# Patient Record
Sex: Male | Born: 1985 | Race: White | Hispanic: No
Health system: Midwestern US, Community
[De-identification: ages and names within clinical notes are randomized; demographics above are authoritative.]

## PROBLEM LIST (undated history)

## (undated) ENCOUNTER — Emergency Department: Discharge status: Absent without leave | Payer: Self-pay

## (undated) DIAGNOSIS — S4990XA Unspecified injury of shoulder and upper arm, unspecified arm, initial encounter: Secondary | ICD-10-CM

## (undated) HISTORY — PX: MULTIPLE TOOTH EXTRACTIONS: SHX2053

---

## 2007-04-30 ENCOUNTER — Emergency Department: Payer: Self-pay | Admitting: Emergency Medicine

## 2007-09-13 ENCOUNTER — Emergency Department: Payer: Self-pay | Admitting: Emergency Medicine

## 2008-06-04 ENCOUNTER — Emergency Department: Payer: Self-pay | Admitting: Emergency Medicine

## 2008-10-28 ENCOUNTER — Emergency Department: Payer: Self-pay | Admitting: Internal Medicine

## 2010-06-06 ENCOUNTER — Emergency Department: Payer: Self-pay | Admitting: Emergency Medicine

## 2012-02-22 ENCOUNTER — Emergency Department: Payer: Self-pay | Admitting: Emergency Medicine

## 2012-05-17 ENCOUNTER — Emergency Department: Payer: Self-pay | Admitting: Emergency Medicine

## 2013-05-28 ENCOUNTER — Emergency Department: Payer: Self-pay | Admitting: Emergency Medicine

## 2013-06-06 ENCOUNTER — Emergency Department: Payer: Self-pay | Admitting: Emergency Medicine

## 2013-06-06 LAB — COMPREHENSIVE METABOLIC PANEL
Alkaline Phosphatase: 120 U/L (ref 50–136)
Anion Gap: 3 — ABNORMAL LOW (ref 7–16)
BUN: 12 mg/dL (ref 7–18)
Bilirubin,Total: 0.5 mg/dL (ref 0.2–1.0)
Chloride: 103 mmol/L (ref 98–107)
Co2: 28 mmol/L (ref 21–32)
Creatinine: 1.06 mg/dL (ref 0.60–1.30)
EGFR (African American): >60
EGFR (Non-African Amer.): >60
Glucose: 94 mg/dL (ref 65–99)
Osmolality: 268 (ref 275–301)
Potassium: 4.1 mmol/L (ref 3.5–5.1)
Total Protein: 8.2 g/dL (ref 6.4–8.2)

## 2013-06-06 LAB — URINALYSIS, COMPLETE
Ph: 6 (ref 4.5–8.0)
Specific Gravity: 1.029 (ref 1.003–1.030)
Squamous Epithelial: <1
WBC UR: 1 /HPF (ref 0–5)

## 2013-06-06 LAB — CBC
HGB: 15.9 g/dL (ref 13.0–18.0)
MCH: 29.6 pg (ref 26.0–34.0)
MCV: 86 fL (ref 80–100)
RBC: 5.39 10*6/uL (ref 4.40–5.90)
RDW: 12.8 % (ref 11.5–14.5)
WBC: 9 10*3/uL (ref 3.8–10.6)

## 2013-10-12 ENCOUNTER — Emergency Department: Payer: Self-pay | Admitting: Internal Medicine

## 2015-08-28 ENCOUNTER — Emergency Department
Admission: EM | Admit: 2015-08-28 | Discharge: 2015-08-28 | Disposition: A | Discharge status: Home / Self Care | Payer: Managed Care, Other (non HMO) | Attending: Emergency Medicine | Admitting: Emergency Medicine

## 2015-08-28 ENCOUNTER — Encounter: Payer: Self-pay | Admitting: Emergency Medicine

## 2015-08-28 DIAGNOSIS — K2970 Gastritis, unspecified, without bleeding: Secondary | ICD-10-CM | POA: Insufficient documentation

## 2015-08-28 DIAGNOSIS — Z87891 Personal history of nicotine dependence: Secondary | ICD-10-CM | POA: Insufficient documentation

## 2015-08-28 DIAGNOSIS — R51 Headache: Secondary | ICD-10-CM | POA: Insufficient documentation

## 2015-08-28 DIAGNOSIS — R1110 Vomiting, unspecified: Secondary | ICD-10-CM | POA: Diagnosis present

## 2015-08-28 DIAGNOSIS — H53149 Visual discomfort, unspecified: Secondary | ICD-10-CM | POA: Diagnosis not present

## 2015-08-28 LAB — CBC
HEMATOCRIT: 41.5 % (ref 40.0–52.0)
Hemoglobin: 14.2 g/dL (ref 13.0–18.0)
MCH: 30.4 pg (ref 26.0–34.0)
MCHC: 34.2 g/dL (ref 32.0–36.0)
MCV: 88.7 fL (ref 80.0–100.0)
Platelets: 228 10*3/uL (ref 150–440)
RBC: 4.68 MIL/uL (ref 4.40–5.90)
RDW: 12.9 % (ref 11.5–14.5)
WBC: 11 10*3/uL — ABNORMAL HIGH (ref 3.8–10.6)

## 2015-08-28 LAB — COMPREHENSIVE METABOLIC PANEL
ALBUMIN: 4.6 g/dL (ref 3.5–5.0)
ALK PHOS: 72 U/L (ref 38–126)
ALT: 13 U/L — ABNORMAL LOW (ref 17–63)
AST: 19 U/L (ref 15–41)
Anion gap: 7 (ref 5–15)
BILIRUBIN TOTAL: 1.3 mg/dL — AB (ref 0.3–1.2)
BUN: 17 mg/dL (ref 6–20)
CO2: 24 mmol/L (ref 22–32)
Calcium: 9.3 mg/dL (ref 8.9–10.3)
Chloride: 101 mmol/L (ref 101–111)
Creatinine, Ser: 0.92 mg/dL (ref 0.61–1.24)
GFR calc Af Amer: >60 mL/min (ref >60)
GFR calc non Af Amer: >60 mL/min (ref >60)
GLUCOSE: 128 mg/dL — AB (ref 65–99)
POTASSIUM: 3.6 mmol/L (ref 3.5–5.1)
SODIUM: 132 mmol/L — AB (ref 135–145)
TOTAL PROTEIN: 7.5 g/dL (ref 6.5–8.1)

## 2015-08-28 LAB — LIPASE, BLOOD: Lipase: 29 U/L (ref 11–51)

## 2015-08-28 MED ORDER — SUCRALFATE 1 G PO TABS: 1.0000 g | ORAL_TABLET | Freq: Four times a day (QID) | ORAL | Status: DC | PRN

## 2015-08-28 MED ORDER — MORPHINE SULFATE (PF) 4 MG/ML IV SOLN
INTRAVENOUS | Status: AC
  Administered 2015-08-28: 4 mg via INTRAVENOUS
  Filled 2015-08-28: qty 1

## 2015-08-28 MED ORDER — ONDANSETRON HCL 4 MG/2ML IJ SOLN
4.0000 mg | Freq: Once | INTRAMUSCULAR | Status: AC | PRN
  Administered 2015-08-28: 4 mg via INTRAVENOUS
  Filled 2015-08-28: qty 2

## 2015-08-28 MED ORDER — MORPHINE SULFATE (PF) 4 MG/ML IV SOLN
4.0000 mg | Freq: Once | INTRAVENOUS | Status: AC
  Administered 2015-08-28: 4 mg via INTRAVENOUS

## 2015-08-28 MED ORDER — SODIUM CHLORIDE 0.9 % IV BOLUS (SEPSIS)
1000.0000 mL | INTRAVENOUS | Status: AC
  Administered 2015-08-28: 1000 mL via INTRAVENOUS

## 2015-08-28 MED ORDER — ONDANSETRON HCL 4 MG PO TABS: ORAL_TABLET | ORAL | Status: DC

## 2015-08-28 NOTE — ED Notes (Signed)
Pt to rm 15 via EMS from home.  PT reports he was awoken by severe generalized abd pain, described as tight, and vomited x 7.  Per EMS, emesis was clear.  Pt denies eating anything unusual.  Pt reports mild headache after work yesterday.  Pt NAD at this time, respirations equal, mildly labored and tachypneic, skin warm and dry.

## 2015-08-28 NOTE — ED Notes (Signed)
Pt's o2sat 75% on RA while pt almost asleep.  2L via [place] applied, pt o2sat 100%.

## 2015-08-28 NOTE — ED Notes (Signed)
Pt oxygen turned off to assess if pt can maintain o2sat on his own

## 2015-08-28 NOTE — ED Provider Notes (Signed)
[facility] Emergency Department Provider Note  ____________________________________________  Time seen: Approximately 5:48 AM  I have reviewed the triage vital signs and the nursing notes.   HISTORY  Chief Complaint Emesis    HPI Jesus Butler is a 30 y.o. male who arrives by EMS and who has no significant past medical history who presents with acute onset of vomiting multiple times just prior to arrival.He states that he did not feel well all day yesterday but was able to work his full shift.  He has not had much appetite.  When he came home he was fell asleep for a little bit on the couch and then he got up to go to bed but then immediately became very nauseated to the point of vomiting while he was running to the bathroom.  He vomited multiple times in a row "a bunch of clear liquid".  He has had generalized abdominal cramping as well which was severe but now is mild.  He had 2 loose stools over the last 24-48 hours but no copious diarrhea.  He endorses subjective chills.  He denies chest pain and shortness of breath.  He did not have any bad food exposures of which she is aware and has no recent sick contacts.   History reviewed. No pertinent past medical history.  There are no active problems to display for this patient.   History reviewed. No pertinent past surgical history.  Current Outpatient Rx  Name  Route  Sig  Dispense  Refill  . ondansetron (ZOFRAN) 4 MG tablet      Take 1-2 tabs by mouth every 8 hours as needed for nausea/vomiting   30 tablet   0   . sucralfate (CARAFATE) 1 g tablet   Oral   Take 1 tablet (1 g total) by mouth 4 (four) times daily as needed (for nausea/vomiting or abdominal discomfort).   30 tablet   1     Allergies Review of patient's allergies indicates no known allergies.  History reviewed. No pertinent family history.  Social History Social History  Substance Use Topics  . Smoking status: Former Games developer  .  Smokeless tobacco: Never Used  . Alcohol Use: Yes    Review of Systems Constitutional: Subjective fever/chills Eyes: No visual changes. ENT: No sore throat. Cardiovascular: Denies chest pain. Respiratory: Denies shortness of breath. Gastrointestinal: Generalized abdominal cramping and multiple episodes of vomiting and 2 loose stools Genitourinary: Negative for dysuria. Musculoskeletal: Negative for back pain. Skin: Negative for rash. Neurological: Generalized global headache with mild photophobia  10-point ROS otherwise negative.  ____________________________________________   PHYSICAL EXAM:  VITAL SIGNS: ED Triage Vitals  Enc Vitals Group     BP 08/28/15 0524 121/64 mmHg     Pulse Rate 08/28/15 0524 72     Resp 08/28/15 0524 25     Temp 08/28/15 0524 97.5 F (36.4 C)     Temp src --      SpO2 08/28/15 0524 100 %     Weight 08/28/15 0524 142 lb (64.411 kg)     Height 08/28/15 0524  (1.778 m)     Head Cir --      Peak Flow --      Pain Score 08/28/15 0525 6     Pain Loc --      Pain Edu? --      Excl. in GC? --     Constitutional: Alert and oriented. Generally Well appearing though he does appear mildly uncomfortable.  Eyes: Conjunctivae are normal. PERRL. EOMI. Head: Atraumatic. Nose: No congestion/rhinnorhea. Mouth/Throat: Mucous membranes are moist.  Oropharynx non-erythematous. Neck: No stridor.   Cardiovascular: Normal rate, regular rhythm. Grossly normal heart sounds.  Good peripheral circulation. Respiratory: Normal respiratory effort.  No retractions. Lungs CTAB. Gastrointestinal: Healthy habitus.  Soft.  Mild tenderness to palpation of epigastrium.  No RUQ tenderness nor Murphy's sign.  No RLQ tenderness nor pain and McBurney's point. Musculoskeletal: No lower extremity tenderness nor edema.  No joint effusions. Neurologic:  Normal speech and language. No gross focal neurologic deficits are appreciated.  Skin:  Skin is warm, dry and intact. No rash  noted. Psychiatric: Mood and affect are anxious. Speech normal.  ____________________________________________   LABS (all labs ordered are listed, but only abnormal results are displayed)  Labs Reviewed  COMPREHENSIVE METABOLIC PANEL - Abnormal; Notable for the following:    Sodium 132 (*)    Glucose, Bld 128 (*)    ALT 13 (*)    Total Bilirubin 1.3 (*)    All other components within normal limits  CBC - Abnormal; Notable for the following:    WBC 11.0 (*)    All other components within normal limits  LIPASE, BLOOD   ____________________________________________  EKG  ED ECG REPORT I, Thamas Appleyard, the attending physician, personally viewed and interpreted this ECG.  Date: 08/28/2015 EKG Time: 05:26 Rate: 64 Rhythm: normal sinus rhythm QRS Axis: normal Intervals: normal ST/T Wave abnormalities: normal Conduction Disutrbances: none Narrative Interpretation: unremarkable  ____________________________________________  RADIOLOGY   No results found.  ____________________________________________   PROCEDURES  Procedure(s) performed: None  Critical Care performed: No ____________________________________________   INITIAL IMPRESSION / ASSESSMENT AND PLAN / ED COURSE  Pertinent labs & imaging results that were available during my care of the patient were reviewed by me and considered in my medical decision making (see chart for details).  Signs/symptoms c/w gastritis vs gastroenteritis (if you include the recent loose stools).  Labs reassuring, VSS, febrile.  Provided morphine 4 mg IV for complaints of general malaise and epigastric pain, somnolent now.  Symptoms controlled.    I gave my usual and customary return precautions.     ____________________________________________  FINAL CLINICAL IMPRESSION(S) / ED DIAGNOSES  Final diagnoses:  Gastritis      NEW MEDICATIONS STARTED DURING THIS VISIT:  New Prescriptions   ONDANSETRON (ZOFRAN) 4 MG TABLET     Take 1-2 tabs by mouth every 8 hours as needed for nausea/vomiting   SUCRALFATE (CARAFATE) 1 G TABLET    Take 1 tablet (1 g total) by mouth 4 (four) times daily as needed (for nausea/vomiting or abdominal discomfort).     Loleta Rose, MD 08/28/15 [id number]

## 2015-08-28 NOTE — Discharge Instructions (Signed)

## 2015-08-28 NOTE — ED Notes (Signed)
Pt c/o pain above iv site when IV removed. Site assessed, vein felt soft, painful to touch, no s/s of infiltration. Pt told to watch for s/s of infection, ie, increasing pain, redness above site, drainage, and to come here or go to acute care if he noticed any change.

## 2015-08-28 NOTE — ED Notes (Signed)
Pt states unable to give urine sample at this time.  Pt given urinal and asked to press call bell if he needs assistance.  Pt verbalized understanding.

## 2015-10-07 ENCOUNTER — Emergency Department
Admission: EM | Admit: 2015-10-07 | Discharge: 2015-10-07 | Disposition: A | Discharge status: Home / Self Care | Payer: Managed Care, Other (non HMO) | Attending: Emergency Medicine | Admitting: Emergency Medicine

## 2015-10-07 DIAGNOSIS — K922 Gastrointestinal hemorrhage, unspecified: Secondary | ICD-10-CM | POA: Insufficient documentation

## 2015-10-07 DIAGNOSIS — Z87891 Personal history of nicotine dependence: Secondary | ICD-10-CM | POA: Diagnosis not present

## 2015-10-07 DIAGNOSIS — Z791 Long term (current) use of non-steroidal anti-inflammatories (NSAID): Secondary | ICD-10-CM | POA: Diagnosis not present

## 2015-10-07 DIAGNOSIS — K625 Hemorrhage of anus and rectum: Secondary | ICD-10-CM | POA: Diagnosis present

## 2015-10-07 LAB — COMPREHENSIVE METABOLIC PANEL
ALBUMIN: 4.9 g/dL (ref 3.5–5.0)
ALK PHOS: 84 U/L (ref 38–126)
ALT: 14 U/L — ABNORMAL LOW (ref 17–63)
ANION GAP: 7 (ref 5–15)
AST: 21 U/L (ref 15–41)
BUN: 26 mg/dL — ABNORMAL HIGH (ref 6–20)
CO2: 29 mmol/L (ref 22–32)
Calcium: 10.1 mg/dL (ref 8.9–10.3)
Chloride: 103 mmol/L (ref 101–111)
Creatinine, Ser: 0.98 mg/dL (ref 0.61–1.24)
GFR calc Af Amer: >60 mL/min (ref >60)
GFR calc non Af Amer: >60 mL/min (ref >60)
GLUCOSE: 95 mg/dL (ref 65–99)
POTASSIUM: 4.3 mmol/L (ref 3.5–5.1)
SODIUM: 139 mmol/L (ref 135–145)
Total Bilirubin: 1.1 mg/dL (ref 0.3–1.2)
Total Protein: 7.8 g/dL (ref 6.5–8.1)

## 2015-10-07 LAB — CBC
HEMATOCRIT: 44.4 % (ref 40.0–52.0)
HEMOGLOBIN: 15.2 g/dL (ref 13.0–18.0)
MCH: 30.1 pg (ref 26.0–34.0)
MCHC: 34.2 g/dL (ref 32.0–36.0)
MCV: 88 fL (ref 80.0–100.0)
Platelets: 243 10*3/uL (ref 150–440)
RBC: 5.04 MIL/uL (ref 4.40–5.90)
RDW: 13 % (ref 11.5–14.5)
WBC: 6.8 10*3/uL (ref 3.8–10.6)

## 2015-10-07 LAB — TYPE AND SCREEN
ABO/RH(D): A NEG
Antibody Screen: NEGATIVE

## 2015-10-07 LAB — ABO/RH: ABO/RH(D): A NEG

## 2015-10-07 NOTE — ED Notes (Signed)
Pt reports rectal bleeding after bowel movement.  Pt  started taking diclofenac on Monday.

## 2015-10-07 NOTE — Discharge Instructions (Signed)
Gastrointestinal Bleeding °Gastrointestinal bleeding is bleeding somewhere along the path that food travels through the body (digestive tract). This path is anywhere between the mouth and the opening of the butt (anus). You may have blood in your throw up (vomit) or in your poop (stools). If there is a lot of bleeding, you may need to stay in the hospital. °HOME CARE °· Only take medicine as told by your doctor. °· Eat foods with fiber such as whole grains, fruits, and vegetables. You can also try eating 1 to 3 prunes a day. °· Drink enough fluids to keep your pee (urine) clear or pale yellow. °GET HELP RIGHT AWAY IF:  °· Your bleeding gets worse. °· You feel dizzy, weak, or you pass out (faint). °· You have bad cramps in your back or belly (abdomen). °· You have large blood clumps (clots) in your poop. °· Your problems are getting worse. °MAKE SURE YOU:  °· Understand these instructions. °· Will watch your condition. °· Will get help right away if you are not doing well or get worse. °  °This information is not intended to replace advice given to you by your health care provider. Make sure you discuss any questions you have with your health care provider. °  °Document Released: 05/03/2008 Document Revised: 07/11/2012 Document Reviewed: 01/12/2015 °Elsevier Interactive Patient Education ©2016 [employer] ° °

## 2015-10-07 NOTE — ED Provider Notes (Signed)
[facility] Emergency Department Provider Note  ____________________________________________  Time seen: Approximately 7:25 PM  I have reviewed the triage vital signs and the nursing notes.   HISTORY  Chief Complaint Rectal Bleeding    HPI Jesus Butler is a 30 y.o. male with a history of a shoulder injury at work 1-2 months ago who is presenting with abdominal pain as well as blood per rectum. He says that he was recently started on diclofenac this past Monday for his left shoulder pain from his workplace injury. He says that since taking the diclofenac is had intermittent abdominal pain in both upper and lower abdomen. He then said that he had 2 bowel movements today and the second one included some bleeding. He said that there was some bleeding on the toilet tissue as well as in the bowl. He says that it was not bright but darker shades of red. He denies any abdominal pain at this time. Denies any history of reflux. Has taken ibuprofen and Aleve in the past without any issue. He said that he did have some constipation and this afternoon with pushing on moving his bowels but has no history of constipation.   No past medical history on file.  There are no active problems to display for this patient.   No past surgical history on file.  Current Outpatient Rx  Name  Route  Sig  Dispense  Refill  . diclofenac (VOLTAREN) 75 MG EC tablet   Oral   Take 75 mg by mouth 2 (two) times daily.         . ondansetron (ZOFRAN) 4 MG tablet      Take 1-2 tabs by mouth every 8 hours as needed for nausea/vomiting   30 tablet   0   . sucralfate (CARAFATE) 1 g tablet   Oral   Take 1 tablet (1 g total) by mouth 4 (four) times daily as needed (for nausea/vomiting or abdominal discomfort).   30 tablet   1     Allergies Review of patient's allergies indicates no known allergies.  No family history on file.  Social History Social History  Substance Use Topics   . Smoking status: Former Games developer  . Smokeless tobacco: Never Used  . Alcohol Use: Yes    Review of Systems Constitutional: No fever/chills Eyes: No visual changes. ENT: No sore throat. Cardiovascular: Denies chest pain. Respiratory: Denies shortness of breath. Gastrointestinal: No abdominal pain.   Genitourinary: Negative for dysuria. Musculoskeletal: Negative for back pain. Skin: Negative for rash. Neurological: Negative for headaches, focal weakness or numbness.  10-point ROS otherwise negative.  ____________________________________________   PHYSICAL EXAM:  VITAL SIGNS: ED Triage Vitals  Enc Vitals Group     BP 10/07/15 1745 131/72 mmHg     Pulse Rate 10/07/15 1745 82     Resp 10/07/15 1745 20     Temp 10/07/15 1745 99 F (37.2 C)     Temp Source 10/07/15 1745 Oral     SpO2 10/07/15 1745 96 %     Weight 10/07/15 1745 149 lb (67.586 kg)     Height 10/07/15 1745  (1.803 m)     Head Cir --      Peak Flow --      Pain Score --      Pain Loc --      Pain Edu? --      Excl. in GC? --     Constitutional: Alert and oriented. Well appearing and  in no acute distress. Eyes: Conjunctivae are normal. PERRL. EOMI. Head: Atraumatic. Nose: No congestion/rhinnorhea. Mouth/Throat: Mucous membranes are moist.   Neck: No stridor.   Cardiovascular: Normal rate, regular rhythm. Grossly normal heart sounds.  Good peripheral circulation. Respiratory: Normal respiratory effort.  No retractions. Lungs CTAB. Gastrointestinal: Soft and nontender. No distention. No CVA tenderness. Rectal exam with normal external exam without any hemorrhoids or fissures. Digital exam with brown stool which is heme-negative. Musculoskeletal: No lower extremity tenderness nor edema.  No joint effusions. Neurologic:  Normal speech and language. No gross focal neurologic deficits are appreciated.  Skin:  Skin is warm, dry and intact. No rash noted. Psychiatric: Mood and affect are normal. Speech and  behavior are normal.  ____________________________________________   LABS (all labs ordered are listed, but only abnormal results are displayed)  Labs Reviewed  COMPREHENSIVE METABOLIC PANEL - Abnormal; Notable for the following:    BUN 26 (*)    ALT 14 (*)    All other components within normal limits  CBC  POC OCCULT BLOOD, ED  TYPE AND SCREEN  ABO/RH   ____________________________________________  EKG   ____________________________________________  RADIOLOGY   ____________________________________________   PROCEDURES   ____________________________________________   INITIAL IMPRESSION / ASSESSMENT AND PLAN / ED COURSE  Pertinent labs & imaging results that were available during my care of the patient were reviewed by me and considered in my medical decision making (see chart for details).  Patient with very reassuring exam as well as lab workup. He will discontinue the diclofenac as he says all the symptoms started once he started taking the diclofenac. He says he also has been drinking 2 monster protein drinks a day and we'll cut back on this as well.  He knows to return for any worsening or concerning symptoms. He will also use a heating pad the left shoulder and has number I scheduled next week. Possible internal hemorrhoid versus abrasion from a hard stool while pushing this afternoon. No obvious source found for the bleeding. ____________________________________________   FINAL CLINICAL IMPRESSION(S) / ED DIAGNOSES  Acute GI bleeding.    Myrna Blazer, MD 10/07/15 1946

## 2015-10-07 NOTE — ED Notes (Addendum)
Pt reports that he had some abdominal pain last night but it wasn't bad. This morning, he found blood on his toilet paper, as well as blood in the toilet ("5 or 6 drops"). Pt states that he is taking diclofenac prescribed on Monday for left shoulder pain by Dr. Rosita Kea. Pt denies pain in his abdomen currently, but states there was some earlier today. Denies n/v/d/hemorrhoids.

## 2015-11-30 ENCOUNTER — Encounter: Payer: Self-pay | Admitting: *Deleted

## 2015-12-09 ENCOUNTER — Encounter
Admission: RE | Disposition: A | Discharge status: Home / Self Care | Payer: Self-pay | Source: Ambulatory Visit | Attending: Surgery

## 2015-12-09 ENCOUNTER — Ambulatory Visit
Admission: RE | Admit: 2015-12-09 | Discharge: 2015-12-09 | Disposition: A | Discharge status: Home / Self Care | Payer: Worker's Compensation | Source: Ambulatory Visit | Attending: Surgery | Admitting: Surgery

## 2015-12-09 ENCOUNTER — Ambulatory Visit: Payer: Worker's Compensation | Admitting: Anesthesiology

## 2015-12-09 DIAGNOSIS — Z91048 Other nonmedicinal substance allergy status: Secondary | ICD-10-CM | POA: Insufficient documentation

## 2015-12-09 DIAGNOSIS — F909 Attention-deficit hyperactivity disorder, unspecified type: Secondary | ICD-10-CM | POA: Insufficient documentation

## 2015-12-09 DIAGNOSIS — F913 Oppositional defiant disorder: Secondary | ICD-10-CM | POA: Diagnosis not present

## 2015-12-09 DIAGNOSIS — Z888 Allergy status to other drugs, medicaments and biological substances status: Secondary | ICD-10-CM | POA: Insufficient documentation

## 2015-12-09 DIAGNOSIS — M25812 Other specified joint disorders, left shoulder: Secondary | ICD-10-CM | POA: Diagnosis present

## 2015-12-09 DIAGNOSIS — M7592 Shoulder lesion, unspecified, left shoulder: Secondary | ICD-10-CM | POA: Insufficient documentation

## 2015-12-09 DIAGNOSIS — F17210 Nicotine dependence, cigarettes, uncomplicated: Secondary | ICD-10-CM | POA: Diagnosis not present

## 2015-12-09 HISTORY — PX: SHOULDER ARTHROSCOPY: SHX128

## 2015-12-09 HISTORY — DX: Unspecified injury of shoulder and upper arm, unspecified arm, initial encounter: S49.90XA

## 2015-12-09 SURGERY — ARTHROSCOPY, SHOULDER
Anesthesia: Regional | Laterality: Left | Wound class: Clean

## 2015-12-09 MED ORDER — ONDANSETRON HCL 4 MG/2ML IJ SOLN
INTRAMUSCULAR | Status: DC | PRN
  Administered 2015-12-09: 4 mg via INTRAVENOUS

## 2015-12-09 MED ORDER — METOCLOPRAMIDE HCL 5 MG/ML IJ SOLN: 5.0000 mg | Freq: Three times a day (TID) | INTRAMUSCULAR | Status: DC | PRN

## 2015-12-09 MED ORDER — ROPIVACAINE HCL 5 MG/ML IJ SOLN
INTRAMUSCULAR | Status: DC | PRN
  Administered 2015-12-09: 35 mL via PERINEURAL

## 2015-12-09 MED ORDER — BUPIVACAINE-EPINEPHRINE (PF) 0.5% -1:200000 IJ SOLN
INTRAMUSCULAR | Status: DC | PRN
  Administered 2015-12-09: 30 mL

## 2015-12-09 MED ORDER — LIDOCAINE HCL (CARDIAC) 20 MG/ML IV SOLN
INTRAVENOUS | Status: DC | PRN
  Administered 2015-12-09: 30 mg via INTRATRACHEAL

## 2015-12-09 MED ORDER — LACTATED RINGERS IV SOLN
INTRAVENOUS | Status: DC
  Administered 2015-12-09: 12:00:00 via INTRAVENOUS

## 2015-12-09 MED ORDER — PROPOFOL 10 MG/ML IV BOLUS
INTRAVENOUS | Status: DC | PRN
  Administered 2015-12-09: 200 mg via INTRAVENOUS

## 2015-12-09 MED ORDER — HYDROCODONE-ACETAMINOPHEN 5-325 MG PO TABS: 1.0000 | ORAL_TABLET | Freq: Four times a day (QID) | ORAL | Status: DC | PRN

## 2015-12-09 MED ORDER — CEFAZOLIN SODIUM-DEXTROSE 2-4 GM/100ML-% IV SOLN
2.0000 g | Freq: Once | INTRAVENOUS | Status: AC
  Administered 2015-12-09: 2 g via INTRAVENOUS

## 2015-12-09 MED ORDER — MIDAZOLAM HCL 2 MG/2ML IJ SOLN
INTRAMUSCULAR | Status: DC | PRN
  Administered 2015-12-09: 2 mg via INTRAVENOUS

## 2015-12-09 MED ORDER — FENTANYL CITRATE (PF) 100 MCG/2ML IJ SOLN
INTRAMUSCULAR | Status: DC | PRN
  Administered 2015-12-09: 100 ug via INTRAVENOUS

## 2015-12-09 MED ORDER — DEXAMETHASONE SODIUM PHOSPHATE 4 MG/ML IJ SOLN
INTRAMUSCULAR | Status: DC | PRN
  Administered 2015-12-09: 8 mg via INTRAVENOUS
  Administered 2015-12-09: 4 mg via PERINEURAL

## 2015-12-09 MED ORDER — ONDANSETRON HCL 4 MG/2ML IJ SOLN: 4.0000 mg | Freq: Four times a day (QID) | INTRAMUSCULAR | Status: DC | PRN

## 2015-12-09 MED ORDER — METOCLOPRAMIDE HCL 5 MG PO TABS: 5.0000 mg | ORAL_TABLET | Freq: Three times a day (TID) | ORAL | Status: DC | PRN

## 2015-12-09 MED ORDER — POTASSIUM CHLORIDE IN NACL 20-0.9 MEQ/L-% IV SOLN: INTRAVENOUS | Status: DC

## 2015-12-09 MED ORDER — HYDROCODONE-ACETAMINOPHEN 5-325 MG PO TABS: 1.0000 | ORAL_TABLET | ORAL | Status: DC | PRN

## 2015-12-09 MED ORDER — FENTANYL CITRATE (PF) 100 MCG/2ML IJ SOLN: 25.0000 ug | INTRAMUSCULAR | Status: DC | PRN

## 2015-12-09 MED ORDER — ONDANSETRON HCL 4 MG/2ML IJ SOLN: 4.0000 mg | Freq: Once | INTRAMUSCULAR | Status: DC | PRN

## 2015-12-09 MED ORDER — ONDANSETRON HCL 4 MG PO TABS: 4.0000 mg | ORAL_TABLET | Freq: Four times a day (QID) | ORAL | Status: DC | PRN

## 2015-12-09 SURGICAL SUPPLY — 35 items
BIT DRILL JUGRKNT W/NDL BIT2.9 (DRILL) IMPLANT
BLADE FULL RADIUS 3.5 (BLADE) ×3 IMPLANT
BUR ACROMIONIZER 4.0 (BURR) ×3 IMPLANT
CANNULA SHAVER 8MMX76MM (CANNULA) ×3 IMPLANT
CHLORAPREP W/TINT 26ML (MISCELLANEOUS) ×3 IMPLANT
COVER LIGHT HANDLE UNIVERSAL (MISCELLANEOUS) ×6 IMPLANT
COVER MAYO STAND STRL (DRAPES) ×3 IMPLANT
DRAPE IMP U-DRAPE 54X76 (DRAPES) ×6 IMPLANT
DRILL JUGGERKNOT W/NDL BIT 2.9 (DRILL)
GAUZE PETRO XEROFOAM 1X8 (MISCELLANEOUS) ×3 IMPLANT
GAUZE SPONGE 4X4 12PLY STRL (GAUZE/BANDAGES/DRESSINGS) ×3 IMPLANT
GLOVE BIO SURGEON STRL SZ8 (GLOVE) ×6 IMPLANT
GLOVE INDICATOR 8.0 STRL GRN (GLOVE) ×3 IMPLANT
GOWN STRL REUS W/ TWL LRG LVL3 (GOWN DISPOSABLE) ×1 IMPLANT
GOWN STRL REUS W/ TWL XL LVL3 (GOWN DISPOSABLE) ×1 IMPLANT
GOWN STRL REUS W/TWL LRG LVL3 (GOWN DISPOSABLE) ×2
GOWN STRL REUS W/TWL XL LVL3 (GOWN DISPOSABLE) ×2
IV LACTATED RINGER IRRG 3000ML (IV SOLUTION) ×4
IV LR IRRIG 3000ML ARTHROMATIC (IV SOLUTION) ×2 IMPLANT
KIT ROOM TURNOVER OR (KITS) ×3 IMPLANT
MANIFOLD 4PT FOR NEPTUNE1 (MISCELLANEOUS) ×3 IMPLANT
MAT BLUE FLOOR 46X72 FLO (MISCELLANEOUS) ×3 IMPLANT
NEEDLE HYPO 21X1.5 SAFETY (NEEDLE) ×3 IMPLANT
PACK ARTHROSCOPY SHOULDER (MISCELLANEOUS) ×3 IMPLANT
PAD GROUND ADULT SPLIT (MISCELLANEOUS) ×3 IMPLANT
STAPLER SKIN PROX 35W (STAPLE) ×3 IMPLANT
STRAP BODY AND KNEE 60X3 (MISCELLANEOUS) ×6 IMPLANT
SUT ETHIBOND 0 MO6 C/R (SUTURE) IMPLANT
SUT VIC AB 2-0 CT1 27 (SUTURE) ×4
SUT VIC AB 2-0 CT1 TAPERPNT 27 (SUTURE) ×2 IMPLANT
TAPE MICROFOAM 4IN (TAPE) ×3 IMPLANT
TUBING ARTHRO INFLOW-ONLY STRL (TUBING) ×3 IMPLANT
TUBING CONNECTING 10 (TUBING) ×2 IMPLANT
TUBING CONNECTING 10' (TUBING) ×1
WAND HAND CNTRL MULTIVAC 90 (MISCELLANEOUS) ×3 IMPLANT

## 2015-12-09 NOTE — Op Note (Signed)
12/09/2015  3:12 PM  Patient:   Jesus Butler  Pre-Op Diagnosis:   Impingement/tendinitis with possible SLAP tear, left shoulder.  Postoperative diagnosis: Impingement/tendinitis, left shoulder.  Procedure: Arthroscopic subacromial decompression, left shoulder.  Anesthesia: General LMA with interscalene block placed preoperatively by the anesthesiologist.  Surgeon:   Maryagnes AmosJ. Jeffrey Poggi, MD  Assistant:   None  Findings: As above. The rotator cuff was in excellent condition, as was the biceps tendon. The labrum also was in satisfactory condition. The articular surfaces of the glenoid and humerus both were in excellent condition as well.  Complications: None  Fluids:   800 cc  Estimated blood loss: 5 cc  Tourniquet time: None  Drains: None  Closure: Staples   Brief clinical note: The patient is a 30 year old male with a several month history of left shoulder pain. The patient's symptoms have progressed despite medications, activity modification, etc. The patient's history and examination are consistent with impingement/tendinopathy with a possible SLAP tear. An MRI scan of the left shoulder was inconclusive for rotator cuff or labral pathology. The patient presents at this time for definitive management of these shoulder symptoms.  Procedure: The patient underwent placement of an interscalene block by the anesthesiologist in the preoperative holding area before he was brought into the operating room and lain in the supine position. The patient then underwent general laryngeal mask anesthesia before being repositioned in the beach chair position using the beach chair positioner. The left shoulder and upper extremity were prepped with ChloraPrep solution before being draped sterilely. Preoperative antibiotics were administered. A timeout was performed to confirm the proper surgical site before the expected portal sites and incision site were injected with 0.5%  Sensorcaine with epinephrine. A posterior portal was created and the glenohumeral joint thoroughly inspected with the findings as described above. An anterior portal was created using an outside-in technique. The labrum and rotator cuff were further probed, again confirming the above-noted findings. The instruments were removed from the joint after suctioning the excess fluid.  The camera was repositioned through the posterior portal into the subacromial space. A separate lateral portal was created using an outside-in technique. The 3.5 mm full-radius resector was introduced and used to perform a subtotal bursectomy. The ArthroCare wand was then inserted and used to remove the periosteal tissue off the undersurface of the anterior third of the acromion as well as to recess the coracoacromial ligament from its attachment along the anterior and lateral margins of the acromion. The 4.0 mm acromionizing bur was introduced and used to complete the decompression by removing the undersurface of the anterior third of the acromion. The full radius resector was reintroduced to remove any residual bony debris before the ArthroCare wand was reintroduced to obtain hemostasis. The instruments were then removed from the subacromial space after suctioning the excess fluid.  The portal sites were closed using staples. A sterile bulky dressing was applied to the shoulder before the arm was placed into a shoulder sling. The patient was then awakened, extubated, and returned to the recovery room in satisfactory condition after tolerating the procedure well.

## 2015-12-09 NOTE — Anesthesia Procedure Notes (Addendum)
Anesthesia Regional Block:  Interscalene brachial plexus block  Pre-Anesthetic Checklist: ,, timeout performed, Correct Patient, Correct Site, Correct Laterality, Correct Procedure, Correct Position, site marked, Risks and benefits discussed,  Surgical consent,  Pre-op evaluation,  At surgeon's request and post-op pain management  Laterality: Left  Prep: chloraprep       Needles:  Injection technique: Single-shot  Needle Type: Stimiplex     Needle Length: 10cm 10 cm Needle Gauge: 21 and 21 G    Additional Needles:  Procedures: ultrasound guided (picture in chart) Interscalene brachial plexus block Narrative:  Start time: 12/09/2015 1:01 PM End time: 12/09/2015 1:07 PM Injection made incrementally with aspirations every 5 mL.  Performed by: Personally  Anesthesiologist: Ranee GosselinSTELLA, MICHAEL  Additional Notes: Functioning IV was confirmed and monitors applied. Ultrasound guidance: relevant anatomy identified, needle position confirmed, local anesthetic spread visualized around nerve(s)., vascular puncture avoided.  Image printed for medical record.  Negative aspiration and no paresthesias; incremental administration of local anesthetic. The patient tolerated the procedure well. Vitals signes recorded in RN notes.   Procedure Name: LMA Insertion Date/Time: 12/09/2015 2:11 PM Performed by: Andee PolesBUSH, Braedon Sjogren Pre-anesthesia Checklist: Patient identified, Emergency Drugs available, Suction available, Timeout performed and Patient being monitored Patient Re-evaluated:Patient Re-evaluated prior to inductionOxygen Delivery Method: Circle system utilized Preoxygenation: Pre-oxygenation with 100% oxygen Intubation Type: IV induction LMA: LMA inserted LMA Size: 4.0 Number of attempts: 1 Placement Confirmation: positive ETCO2 and breath sounds checked- equal and bilateral Tube secured with: Tape

## 2015-12-09 NOTE — Anesthesia Postprocedure Evaluation (Signed)
Anesthesia Post Note  Patient: Jesus Butler  Procedure(s) Performed: Procedure(s) (LRB): Arthroscopic subacromial decompression, left shoulder (Left)  Patient location during evaluation: PACU Anesthesia Type: General Level of consciousness: awake and alert and oriented Pain management: satisfactory to patient Vital Signs Assessment: post-procedure vital signs reviewed and stable Respiratory status: spontaneous breathing, nonlabored ventilation and respiratory function stable Cardiovascular status: blood pressure returned to baseline and stable Postop Assessment: Adequate PO intake and No signs of nausea or vomiting Anesthetic complications: no    Cherly BeachStella, [place] Nase J

## 2015-12-09 NOTE — Anesthesia Preprocedure Evaluation (Signed)
Anesthesia Evaluation  Patient identified by MRN, date of birth, ID band  Reviewed: Allergy & Precautions, H&P , NPO status , Patient's Chart, lab work & pertinent test results  Airway Mallampati: II  TM Distance: >3 FB Neck ROM: full    Dental no notable dental hx. (+) Poor Dentition   Pulmonary former smoker,    Pulmonary exam normal        Cardiovascular  Rhythm:regular Rate:Normal     Neuro/Psych    GI/Hepatic   Endo/Other    Renal/GU      Musculoskeletal   Abdominal   Peds  Hematology   Anesthesia Other Findings   Reproductive/Obstetrics                             Anesthesia Physical Anesthesia Plan  ASA: I  Anesthesia Plan: General LMA and Regional   Post-op Pain Management: GA combined w/ Regional for post-op pain   Induction:   Airway Management Planned:   Additional Equipment:   Intra-op Plan:   Post-operative Plan:   Informed Consent: I have reviewed the patients History and Physical, chart, labs and discussed the procedure including the risks, benefits and alternatives for the proposed anesthesia with the patient or authorized representative who has indicated his/her understanding and acceptance.     Plan Discussed with: CRNA  Anesthesia Plan Comments:         Anesthesia Quick Evaluation

## 2015-12-09 NOTE — H&P (Signed)
Paper H&P to be scanned into permanent record. H&P reviewed. No changes. 

## 2015-12-09 NOTE — Discharge Instructions (Signed)
General Anesthesia, Adult, Care After °Refer to this sheet in the next few weeks. These instructions provide you with information on caring for yourself after your procedure. Your health care provider may also give you more specific instructions. Your treatment has been planned according to current medical practices, but problems sometimes occur. Call your health care provider if you have any problems or questions after your procedure. °WHAT TO EXPECT AFTER THE PROCEDURE °After the procedure, it is typical to experience: °· Sleepiness. °· Nausea and vomiting. °HOME CARE INSTRUCTIONS °· For the first 24 hours after general anesthesia: °¨ Have a responsible person with you. °¨ Do not drive a car. If you are alone, do not take public transportation. °¨ Do not drink alcohol. °¨ Do not take medicine that has not been prescribed by your health care provider. °¨ Do not sign important papers or make important decisions. °¨ You may resume a normal diet and activities as directed by your health care provider. °· Change bandages (dressings) as directed. °· If you have questions or problems that seem related to general anesthesia, call the hospital and ask for the anesthetist or anesthesiologist on call. °SEEK MEDICAL CARE IF: °· You have nausea and vomiting that continue the day after anesthesia. °· You develop a rash. °SEEK IMMEDIATE MEDICAL CARE IF:  °· You have difficulty breathing. °· You have chest pain. °· You have any allergic problems. °  °This information is not intended to replace advice given to you by your health care provider. Make sure you discuss any questions you have with your health care provider. °  °Document Released: 10/31/2000 Document Revised: 08/15/2014 Document Reviewed: 11/23/2011 °Elsevier Interactive Patient Education ©2016 [employer] ° °Keep dressing dry and intact.  °May shower after dressing changed on post-op day #4 (Sunday).  °Cover staples/sutures with Band-Aids after drying off. °Apply ice  frequently to shoulder. °Keep shoulder immobilizer on at all times except may remove for bathing purposes. °Follow-up in 10-14 days or as scheduled. °

## 2015-12-09 NOTE — Transfer of Care (Signed)
Immediate Anesthesia Transfer of Care Note  Patient: Jesus Butler  Procedure(s) Performed: Procedure(s): LEFT ARTHROSCOPY SHOULDER WITH DEBRIDEMENT DECOMPRESSION SLAP REPAIR AND POSSIBLE BICEPS TENODESIS (Left)  Patient Location: PACU  Anesthesia Type: General LMA, Regional  Level of Consciousness: awake, alert  and patient cooperative  Airway and Oxygen Therapy: Patient Spontanous Breathing and Patient connected to supplemental oxygen  Post-op Assessment: Post-op Vital signs reviewed, Patient's Cardiovascular Status Stable, Respiratory Function Stable, Patent Airway and No signs of Nausea or vomiting  Post-op Vital Signs: Reviewed and stable  Complications: No apparent anesthesia complications

## 2015-12-09 NOTE — Progress Notes (Signed)
Assisted Mike Stella ANMD with interscalene block. Side rails up, monitors on throughout procedure. See vital signs in flow sheet. Tolerated Procedure well.  

## 2015-12-10 ENCOUNTER — Encounter: Payer: Self-pay | Admitting: Surgery

## 2015-12-14 ENCOUNTER — Encounter: Payer: Self-pay | Admitting: *Deleted

## 2015-12-14 ENCOUNTER — Emergency Department: Payer: Worker's Compensation

## 2015-12-14 ENCOUNTER — Emergency Department
Admission: EM | Admit: 2015-12-14 | Discharge: 2015-12-14 | Disposition: A | Discharge status: Home / Self Care | Payer: Worker's Compensation | Attending: Emergency Medicine | Admitting: Emergency Medicine

## 2015-12-14 DIAGNOSIS — M25512 Pain in left shoulder: Secondary | ICD-10-CM | POA: Diagnosis present

## 2015-12-14 DIAGNOSIS — F17210 Nicotine dependence, cigarettes, uncomplicated: Secondary | ICD-10-CM | POA: Diagnosis not present

## 2015-12-14 DIAGNOSIS — M79622 Pain in left upper arm: Secondary | ICD-10-CM

## 2015-12-14 DIAGNOSIS — M25519 Pain in unspecified shoulder: Secondary | ICD-10-CM

## 2015-12-14 LAB — CBC WITH DIFFERENTIAL/PLATELET
BASOS PCT: 1 %
Basophils Absolute: 0 10*3/uL (ref 0–0.1)
Eosinophils Absolute: 0.4 10*3/uL (ref 0–0.7)
Eosinophils Relative: 5 %
HEMATOCRIT: 43.4 % (ref 40.0–52.0)
HEMOGLOBIN: 15.1 g/dL (ref 13.0–18.0)
Lymphocytes Relative: 24 %
Lymphs Abs: 1.9 10*3/uL (ref 1.0–3.6)
MCH: 30.2 pg (ref 26.0–34.0)
MCHC: 34.8 g/dL (ref 32.0–36.0)
MCV: 86.7 fL (ref 80.0–100.0)
MONO ABS: 0.7 10*3/uL (ref 0.2–1.0)
Monocytes Relative: 9 %
NEUTROS ABS: 4.9 10*3/uL (ref 1.4–6.5)
NEUTROS PCT: 61 %
Platelets: 241 10*3/uL (ref 150–440)
RBC: 5 MIL/uL (ref 4.40–5.90)
RDW: 12.6 % (ref 11.5–14.5)
WBC: 7.9 10*3/uL (ref 3.8–10.6)

## 2015-12-14 LAB — BASIC METABOLIC PANEL
ANION GAP: 7 (ref 5–15)
BUN: 14 mg/dL (ref 6–20)
CALCIUM: 9.6 mg/dL (ref 8.9–10.3)
CO2: 27 mmol/L (ref 22–32)
Chloride: 104 mmol/L (ref 101–111)
Creatinine, Ser: 0.85 mg/dL (ref 0.61–1.24)
Glucose, Bld: 112 mg/dL — ABNORMAL HIGH (ref 65–99)
Potassium: 3.8 mmol/L (ref 3.5–5.1)
Sodium: 138 mmol/L (ref 135–145)

## 2015-12-14 MED ORDER — IBUPROFEN 600 MG PO TABS
ORAL_TABLET | ORAL | Status: AC
  Administered 2015-12-14: 600 mg via ORAL
  Filled 2015-12-14: qty 1

## 2015-12-14 MED ORDER — OXYCODONE-ACETAMINOPHEN 5-325 MG PO TABS
1.0000 | ORAL_TABLET | Freq: Once | ORAL | Status: AC
  Administered 2015-12-14: 1 via ORAL
  Filled 2015-12-14: qty 1

## 2015-12-14 MED ORDER — IBUPROFEN 600 MG PO TABS
600.0000 mg | ORAL_TABLET | Freq: Once | ORAL | Status: AC
  Administered 2015-12-14: 600 mg via ORAL

## 2015-12-14 MED ORDER — OXYCODONE-ACETAMINOPHEN 5-325 MG PO TABS
1.0000 | ORAL_TABLET | Freq: Once | ORAL | Status: AC
  Administered 2015-12-14: 1 via ORAL

## 2015-12-14 MED ORDER — OXYCODONE-ACETAMINOPHEN 5-325 MG PO TABS
ORAL_TABLET | ORAL | Status: AC
Start: 2015-12-14 — End: 2015-12-14
  Administered 2015-12-14: 1 via ORAL
  Filled 2015-12-14: qty 1

## 2015-12-14 MED ORDER — IOPAMIDOL (ISOVUE-300) INJECTION 61%
75.0000 mL | Freq: Once | INTRAVENOUS | Status: AC | PRN
  Administered 2015-12-14: 75 mL via INTRAVENOUS

## 2015-12-14 NOTE — ED Notes (Addendum)
Patient reports surgery on left shoulder last week.  Reports last took medication for pain Sunday at approximately 1am.  Reports no pain all day on Sunday.  Took shower tonight and approximately 30 minutes after began having pain mid-humerus that radiates down into left wrist.  Patient states "it feels like my arm is being ripped open".  Left arm warm to touch, temperature equal to that of right arm; good left radial pulse, cap refill with in normal limits.  Dr. Zenda AlpersWebster in room.

## 2015-12-14 NOTE — ED Notes (Signed)
Pt presents w/ c/o onset of pain in L shoulder. Pt had surgery on L shoulder on past Wed. Pt started having pain x 30 minutes after showering for first time since surgery. Pt states he followed instructions regarding dressing changes and showering. Pt states 30 minutes after shower he began to have increasing pain from L shoulder to L hand. Pt states last time he took pain meds was 24 hrs prior to his shower. Pt did not take any pain meds after he started having pain. Pt is in acute distress w/ regard to pain at this time. Pt is wearing sling appropriate.

## 2015-12-14 NOTE — Discharge Instructions (Signed)
You were evaluated for left arm pain, and although no certain cause was found, your exam and evaluation and emergent department are reassuring. I suspect postoperative inflammation of the nerve causing your symptoms. Continue anti-inflammatory as well as your pain medication. I'm recommending that he follow up with your orthopedic surgeon.  Return to emergency department for any worsening condition including worsening pain, new weakness or numbness, redness or skin rash, dizziness or passing out, fever, or any other symptoms concerning to you.   Pain Without a Known Cause WHAT IS PAIN WITHOUT A KNOWN CAUSE? Pain can occur in any part of the body and can range from mild to severe. Sometimes no cause can be found for why you are having pain. Some types of pain that can occur without a known cause include:   Headache.  Back pain.  Abdominal pain.  Neck pain. HOW IS PAIN WITHOUT A KNOWN CAUSE DIAGNOSED?  Your health care provider will try to find the cause of your pain. This may include:  Physical exam.  Medical history.  Blood tests.  Urine tests.  X-rays. If no cause is found, your health care provider may diagnose you with pain without a known cause.  IS THERE TREATMENT FOR PAIN WITHOUT A CAUSE?  Treatment depends on the kind of pain you have. Your health care provider may prescribe medicines to help relieve your pain.  WHAT CAN I DO AT HOME FOR MY PAIN?   Take medicines only as directed by your health care provider.  Stop any activities that cause pain. During periods of severe pain, bed rest may help.  Try to reduce your stress with activities such as yoga or meditation. Talk to your health care provider for other stress-reducing activity recommendations.  Exercise regularly, if approved by your health care provider.  Eat a healthy diet that includes fruits and vegetables. This may improve pain. Talk to your health care provider if you have any questions about your  diet. WHAT IF MY PAIN DOES NOT GET BETTER?  If you have a painful condition and no reason can be found for the pain or the pain gets worse, it is important to follow up with your health care provider. It may be necessary to repeat tests and look further for a possible cause.    This information is not intended to replace advice given to you by your health care provider. Make sure you discuss any questions you have with your health care provider.   Document Released: 04/19/2001 Document Revised: 08/15/2014 Document Reviewed: 12/10/2013 Elsevier Interactive Patient Education [employer]2016 Elsevier Inc.

## 2015-12-14 NOTE — ED Provider Notes (Signed)
[facility] Emergency Department Provider Note   ____________________________________________  Time seen: Approximately 4:09 AM  I have reviewed the triage vital signs and the nursing notes.   HISTORY  Chief Complaint Post-op Problem    HPI Jesus Butler is a 30 y.o. male who comes into the hospital today with left shoulder pain. The patient had some shoulder surgery done on 12/09/15 with Dr. Joice LoftsPoggi. He reports that he had been doing well. Today he went to the park and was driving around. As he was out he did not take any of his pain medicine today. He reports that when he came back home he took a nap and then took a shower. He was told that it was okay on day 4 to take a shower. The patient reports that he did not get the areas very wet and that he placed a Band-Aid back on his shoulder afterwards. He put his close on and about 30 minutes afterward started having some severe pain down his mid arm into his hands. He reports that he is having some difficulty making a fist and opening his fingers. He reports that even if someone else tries to passively move his fingers he has severe pain into his arm. This morning he had some numbness to his fingertips but it didn't go away. The patient did not take any pain medicine at home after this started he came in to get checked out. The patient has not had any fevers, no nausea or vomiting. He reports that he does not like taking pain medicines. He is here for evaluation. The patient rates his pain a 10 out of 10 in intensity   Past Medical History  Diagnosis Date  . Shoulder injury     left    There are no active problems to display for this patient.   Past Surgical History  Procedure Laterality Date  . Multiple tooth extractions    . Shoulder arthroscopy Left 12/09/2015    Procedure: Arthroscopic subacromial decompression, left shoulder;  Surgeon: Christena FlakeJohn J Poggi, MD;  [facility];  Service:  Orthopedics;  Laterality: Left;    Current Outpatient Rx  Name  Route  Sig  Dispense  Refill  . HYDROcodone-acetaminophen (NORCO) 5-325 MG tablet   Oral   Take 1-2 tablets by mouth every 6 (six) hours as needed for moderate pain. MAXIMUM TOTAL ACETAMINOPHEN DOSE IS 4000 MG PER DAY   50 tablet   0     Allergies Other and Voltaren  History reviewed. No pertinent family history.  Social History Social History  Substance Use Topics  . Smoking status: Current Every Day Smoker    Types: Cigarettes  . Smokeless tobacco: Never Used     Comment: Quit smoking cigs about 2 yrs ago. Currently "vapes" 3-4x/day.  . Alcohol Use: Yes     Comment: may drink 4-5x/yr    Review of Systems Constitutional: No fever/chills Eyes: No visual changes. ENT: No sore throat. Cardiovascular: Denies chest pain. Respiratory: Denies shortness of breath. Gastrointestinal: No abdominal pain.  No nausea, no vomiting.  No diarrhea.  No constipation. Genitourinary: Negative for dysuria. Musculoskeletal: Left shoulder pain Skin: Negative for rash. Neurological: Intermittent dizziness 10-point ROS otherwise negative.  ____________________________________________   PHYSICAL EXAM:  VITAL SIGNS: ED Triage Vitals  Enc Vitals Group     BP 12/14/15 0336 132/94 mmHg     Pulse Rate 12/14/15 0336 80     Resp 12/14/15 0336 24  Temp 12/14/15 0336 97.8 F (36.6 C)     Temp Source 12/14/15 0336 Oral     SpO2 12/14/15 0336 100 %     Weight 12/14/15 0336 149 lb (67.586 kg)     Height 12/14/15 0336  (1.803 m)     Head Cir --      Peak Flow --      Pain Score 12/14/15 0338 10     Pain Loc --      Pain Edu? --      Excl. in GC? --     Constitutional: Alert and oriented. Well appearing and in Moderate distress. Eyes: Conjunctivae are normal. PERRL. EOMI. Head: Atraumatic. Nose: No congestion/rhinnorhea. Mouth/Throat: Mucous membranes are moist.  Oropharynx non-erythematous. Cardiovascular:  Normal rate, regular rhythm. Grossly normal heart sounds.  Good peripheral circulation. Respiratory: Normal respiratory effort.  No retractions. Lungs CTAB. Gastrointestinal: Soft and nontender. No distention. Positive bowel sounds  Musculoskeletal: Left arm tenderness to palpation from mid arm down to wrist. No swelling to arm, patient able to move fingers but with some difficulty..   Neurologic:  Normal speech and language. Skin:  Skin is warm, dry and intact.  Psychiatric: Mood and affect are normal.   ____________________________________________   LABS (all labs ordered are listed, but only abnormal results are displayed)  Labs Reviewed  BASIC METABOLIC PANEL - Abnormal; Notable for the following:    Glucose, Bld 112 (*)    All other components within normal limits   ____________________________________________  EKG  none ____________________________________________  RADIOLOGY  pending ____________________________________________   PROCEDURES  Procedure(s) performed: None  Critical Care performed: No  ____________________________________________   INITIAL IMPRESSION / ASSESSMENT AND PLAN / ED COURSE  Pertinent labs & imaging results that were available during my care of the patient were reviewed by me and considered in my medical decision making (see chart for details).  This is a 30 year old male who comes into the hospital today with some left shoulder pain. I did give him a Percocet for his pain. I contacted Dr. Rosita Kea and asked if he would recommend a CT scan given that this is not very typical for shoulder surgery recovery. He reports that that sounds a great idea to evaluate for possible hematoma or fluid collection causing nerve impingement. I will reassess the patient once he receives his CT scan.  The patient's care will be signed out to Dr. Shaune Pollack who will follow-up the results of the CT scan and  disposition the  patient. ____________________________________________   FINAL CLINICAL IMPRESSION(S) / ED DIAGNOSES  Final diagnoses:  Shoulder pain      NEW MEDICATIONS STARTED DURING THIS VISIT:  New Prescriptions   No medications on file     Note:  This document was prepared using Dragon voice recognition software and may include unintentional dictation errors.    Rebecka Apley, MD 12/14/15 [phone]

## 2015-12-14 NOTE — ED Notes (Signed)
Patient transported to CT 

## 2015-12-14 NOTE — ED Provider Notes (Signed)
[facility]  I accepted care from [facility] ____________________________________________    LABS (pertinent positives/negatives)  Metabolic panel within normal limits White blood count 7.9 with no left shift. Hemoglobin 15.1 and platelet count 241   ____________________________________________    RADIOLOGY All xrays were viewed by me. Imaging interpreted by radiologist.  CT shoulder left with contrast:  IMPRESSION: 1. No acute osseous abnormality of the left shoulder. 2. 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 This is likely postsurgical given recent surgery on Wednesday versus less likely infection. There is no drainable fluid collection. If there is concern regarding infection, recommend Arthrocentesis.   MRI brain:  IMPRESSION: Negative unenhanced MRI of the brain  Mild mucosal edema in the left maxillary sinus.  ____________________________________________   PROCEDURES  Procedure(s) performed: None  Critical Care performed: None  ____________________________________________   INITIAL IMPRESSION / ASSESSMENT AND PLAN / ED COURSE   Pertinent labs & imaging results that were available during my care of the patient were reviewed by me and considered in my medical decision making (see chart for details).  I reviewed pt's shoulder CT -- no fluid collection or other radiographic source for pt's complaints of left arm pain/parasthesia and finger strength issues.  On my discussion with the patient, waxing and waning level of pain and parasthesia (feels like skin is ripping off) from mid upper arm to fingers with trouble moving 2-4th fingers since about 1:30 AM.  Also 1-2 days of left sided headache, mild to moderate.    No fevers.  Clinically he certainly still seems most plausible that his symptoms are in some ways related to postoperative  inflammation of a peripheral nerve/radiculopathy, however given the new acute onset as well as headaches, I discussed with him obtaining neuro imaging.  I discussed with the radiologist with regard to MRI versus CT, and given that the patient had contrast with his shoulder CT, making this test for noncontrast head CT somewhat less reliable, and patient is young and so neuro radiation risk versus benefit ratio is less, and would obtain brain MRI if head CT negative, the radial just did recommend proceeding with direct MRI brain without contrast imaging.  ----------------------------------------- 11:50 AM on 12/14/2015 -----------------------------------------  I reviewed his MRI of the brain which is unremarkable, without any source for his arm complaints.  Clinically I feel this is likely postinflammatory nerve inflammation, perhaps through the axilla, although a little unusual that he just noticed it today, its possible that since he hadn't been on pain medicine over the last 24 hours, that perhaps this is the first time that he noticed this type of pain?  In any case, I'm not worried about a cervical radiculopathy without any neck pain or trauma.  The only other emergency cause was central, and his MRI of the brain is reassuring.  I am going to go ahead and discharge him home. He can follow-up with orthopedics.  CONSULTATIONS: none    Patient / Family / Caregiver informed of clinical course, medical decision-making process, and agree with plan.   I discussed return precautions, follow-up instructions, and discharged instructions with patient and/or family.     ____________________________________________   FINAL CLINICAL IMPRESSION(S) / ED DIAGNOSES  Final diagnoses:  Shoulder pain  Left upper arm pain        Governor Rooksebecca Rosaisela Jamroz, MD 12/14/15 1156

## 2016-03-23 ENCOUNTER — Emergency Department: Payer: Managed Care, Other (non HMO)

## 2016-03-23 ENCOUNTER — Emergency Department
Admission: EM | Admit: 2016-03-23 | Discharge: 2016-03-23 | Disposition: A | Discharge status: Home / Self Care | Payer: Managed Care, Other (non HMO) | Attending: Emergency Medicine | Admitting: Emergency Medicine

## 2016-03-23 DIAGNOSIS — R002 Palpitations: Secondary | ICD-10-CM | POA: Insufficient documentation

## 2016-03-23 DIAGNOSIS — F17210 Nicotine dependence, cigarettes, uncomplicated: Secondary | ICD-10-CM | POA: Insufficient documentation

## 2016-03-23 LAB — BASIC METABOLIC PANEL
ANION GAP: 12 (ref 5–15)
BUN: 16 mg/dL (ref 6–20)
CALCIUM: 9.8 mg/dL (ref 8.9–10.3)
CO2: 22 mmol/L (ref 22–32)
Chloride: 105 mmol/L (ref 101–111)
Creatinine, Ser: 0.9 mg/dL (ref 0.61–1.24)
GLUCOSE: 90 mg/dL (ref 65–99)
Potassium: 3.6 mmol/L (ref 3.5–5.1)
SODIUM: 139 mmol/L (ref 135–145)

## 2016-03-23 LAB — CBC
HEMATOCRIT: 40.7 % (ref 40.0–52.0)
HEMOGLOBIN: 14.4 g/dL (ref 13.0–18.0)
MCH: 31.1 pg (ref 26.0–34.0)
MCHC: 35.3 g/dL (ref 32.0–36.0)
MCV: 88.1 fL (ref 80.0–100.0)
Platelets: 231 10*3/uL (ref 150–440)
RBC: 4.63 MIL/uL (ref 4.40–5.90)
RDW: 12.9 % (ref 11.5–14.5)
WBC: 10.6 10*3/uL (ref 3.8–10.6)

## 2016-03-23 LAB — TROPONIN I

## 2016-03-23 LAB — FIBRIN DERIVATIVES D-DIMER (ARMC ONLY): FIBRIN DERIVATIVES D-DIMER (ARMC): 156 (ref 0–499)

## 2016-03-23 NOTE — ED Notes (Signed)
Pt resting in bed, resp even and unlabored, pt in no distress 

## 2016-03-23 NOTE — ED Triage Notes (Signed)
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 PT reports his heart began "racing" and he became SOB. Pt reports tinglign in fingers and toes upon EMS arrival and per EMS pt had RR of 50 upon arrival to scene. Pt verbalized chest tightness and pain that worsened upon taking a deep breath and upon palpation. Upon arrival to triage pt was no longer hyperventilating and chest pain went from a 6/1 to a 3/10. Pt remains anxious at this time.

## 2016-03-23 NOTE — ED Notes (Signed)
Registration notified of pt filing workers comp, urine drug screen initiated

## 2016-03-23 NOTE — ED Notes (Signed)
Lab called for add on d-dimer 

## 2016-03-23 NOTE — ED Provider Notes (Signed)
[facility] Emergency Department Provider Note   ____________________________________________   First MD Initiated Contact with Patient 03/23/16 1637     (approximate)  I have reviewed the triage vital signs and the nursing notes.   HISTORY  Chief Complaint Palpitations    HPI Jesus Butler is a 30 y.o. 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 Patient noticed he was very short of breath and his heart was racing he was very sweaty. He told his boss and his boss had him sit down in the shade for 15 minutes. Patient did not get any better. Patient is very short of breath and his heart rate was racing very fast. 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 He thought his heart rate was around 170s or more. Patient was put in an ambulance and came here. On the way here his shortness of breath improved somewhat but then he developed severe sharp stabbing pain with deep breathing in the center of his chest. Currently his symptoms are better he is not short of breath anymore he still has a little bit of pain with deep breaths. His is not tachycardic anymore. He has never had this before. Patient states but does not smoke. He stopped smoking 3 years ago. He has not used drugs. He has not drank a lot of alcohol last night. In any alcohol last night. Patient is never had any of this before.  Past Medical History:  Diagnosis Date  . Shoulder injury    left    There are no active problems to display for this patient.   Past Surgical History:  Procedure Laterality Date  . MULTIPLE TOOTH EXTRACTIONS    . SHOULDER ARTHROSCOPY Left 12/09/2015   Procedure: Arthroscopic subacromial decompression, left shoulder;  Surgeon: Christena FlakeJohn J Poggi, MD;  [facility];  Service: Orthopedics;  Laterality: Left;    Prior to Admission medications   Medication Sig Start Date End Date Taking? Authorizing Provider  HYDROcodone-acetaminophen (NORCO) 5-325 MG  tablet Take 1-2 tablets by mouth every 6 (six) hours as needed for moderate pain. MAXIMUM TOTAL ACETAMINOPHEN DOSE IS 4000 MG PER DAY 12/09/15   Christena FlakeJohn J Poggi, MD    Allergies Other and Voltaren [diclofenac]  No family history on file.  Social History Social History  Substance Use Topics  . Smoking status: Current Every Day Smoker    Types: E-cigarettes  . Smokeless tobacco: Never Used     Comment: Quit smoking cigs about 2 yrs ago. Currently "vapes" 3-4x/day.  . Alcohol use Yes     Comment: may drink 4-5x/yr    Review of Systems  Constitutional: No fever/chills Eyes: No visual changes. ENT: No sore throat. Cardiovascular: See history of present illness Respiratory: See history of present illness Gastrointestinal: No abdominal pain.  No nausea, no vomiting.  No diarrhea.  No constipation. Genitourinary: Negative for dysuria. Musculoskeletal: Negative for back pain. Skin: Negative for rash. Neurological: Negative for headaches, focal weakness or numbness.  10-point ROS otherwise negative.  ____________________________________________   PHYSICAL EXAM:  VITAL SIGNS: ED Triage Vitals  Enc Vitals Group     BP 03/23/16 1410 112/60     Pulse Rate 03/23/16 1409 71     Resp 03/23/16 1409 16     Temp 03/23/16 1409 98.1 F (36.7 C)     Temp Source 03/23/16 1409 Oral     SpO2 03/23/16 1409 100 %     Weight 03/23/16 1410 160 lb (  72.6 kg)     Height 03/23/16 1410 5\' 10"  (1.778 m)     Head Circumference --      Peak Flow --      Pain Score 03/23/16 1411 3     Pain Loc --      Pain Edu? --      Excl. in GC? --     Constitutional: Alert and oriented. Well appearing and in no acute distress. Eyes: Conjunctivae are normal. PERRL. EOMI. Head: Atraumatic. Nose: No congestion/rhinnorhea. Mouth/Throat: Mucous membranes are moist.  Oropharynx non-erythematous. Neck: No stridor.   Cardiovascular: Normal rate, regular rhythm. Grossly normal heart sounds.  Good peripheral  circulation.There is a small amount of pain in the center of his chest with palpation. This reproduces some of his pain. Respiratory: Normal respiratory effort.  No retractions. Lungs CTAB. Gastrointestinal: Soft and nontender. No distention. No abdominal bruits. No CVA tenderness. Musculoskeletal: No lower extremity tenderness nor edema.  No joint effusions. Neurologic:  Normal speech and language. No gross focal neurologic deficits are appreciated. No gait instability. Skin:  Skin is warm, dry and intact. No rash noted. Psychiatric: Mood and affect are normal. Speech and behavior are normal.  ____________________________________________   LABS (all labs ordered are listed, but only abnormal results are displayed)  Labs Reviewed  BASIC METABOLIC PANEL  CBC  TROPONIN I  TROPONIN I  FIBRIN DERIVATIVES D-DIMER ([facility] ONLY)   ____________________________________________  EKG  EKG read and interpreted by me shows sinus rhythm at 69 normal axis essentially normal EKG EKG from EMS looks similar. ____________________________________________  RADIOLOGY  EXAM: CHEST  2 VIEW  COMPARISON:  PA and lateral chest x-ray of June 06, 2013  FINDINGS: The lungs are well-expanded and clear. The heart and pulmonary vascularity are normal. The mediastinum is normal in width. There is no pleural effusion. There is gentle upper thoracic curvature convex toward the left which is stable.  IMPRESSION: There is no active cardiopulmonary disease.   Electronically Signed   By: Peterson  [place]azil[place] M.D.   On: 03/23/2016 14:45 ____________________________________________   PROCEDURES  Procedure(s) performed:   Procedures  Critical Care performed:   ____________________________________________   INITIAL IMPRESSION / ASSESSMENT AND PLAN / ED COURSE  Pertinent labs & imaging results that were available during my care of the patient were reviewed by me and considered in my medical decision  making (see chart for details).  Medical screening examination/treatment/procedure(s) were performed by non-physician practitioner and as supervising physician I was immediately available for consultation/collaboration.    Clinical Course     ____________________________________________   FINAL CLINICAL IMPRESSION(S) / ED DIAGNOSES  Final diagnoses:  Palpitations      NEW MEDICATIONS STARTED DURING THIS VISIT:  Discharge Medication List as of 03/23/2016 10:16 PM       Note:  This document was prepared using Dragon voice recognition software and may include unintentional dictation errors.    Arnaldo NatalPaul F Malinda, MD 03/24/16 [phone]

## 2016-03-23 NOTE — Discharge Instructions (Signed)
Please rest and take it easy tomorrow. Call Dr.Gollan the cardiologist in the morning. If you call him about 8:30 or 9:00 and tell him you in the ER with chest pain and extreme shortness of breath he should be able to see you hopefully tomorrow. Lee's return for any further symptoms. Please make sure to keep your herself hydrated well and don't overheat especially for the next few days.

## 2016-03-28 ENCOUNTER — Ambulatory Visit (INDEPENDENT_AMBULATORY_CARE_PROVIDER_SITE_OTHER): Payer: Self-pay | Admitting: Cardiovascular Disease

## 2016-03-28 ENCOUNTER — Ambulatory Visit (INDEPENDENT_AMBULATORY_CARE_PROVIDER_SITE_OTHER): Payer: Worker's Compensation

## 2016-03-28 ENCOUNTER — Encounter: Payer: Self-pay | Admitting: Cardiovascular Disease

## 2016-03-28 VITALS — BP 106/78 | HR 75 | Ht 70.0 in | Wt 159.8 lb

## 2016-03-28 DIAGNOSIS — R002 Palpitations: Secondary | ICD-10-CM

## 2016-03-28 DIAGNOSIS — R000 Tachycardia, unspecified: Secondary | ICD-10-CM

## 2016-03-28 DIAGNOSIS — R079 Chest pain, unspecified: Secondary | ICD-10-CM

## 2016-03-28 DIAGNOSIS — R0602 Shortness of breath: Secondary | ICD-10-CM

## 2016-03-28 LAB — EXERCISE TOLERANCE TEST
CHL CUP MPHR: 162 {beats}/min
CSEPED: 12 min
CSEPEW: 13.7 METS
CSEPPHR: 157 {beats}/min
Exercise duration (sec): 5 s
Percent HR: 82 %
Rest HR: 75 {beats}/min

## 2016-03-28 NOTE — Patient Instructions (Addendum)
Medication Instructions:  Your physician recommends that you continue on your current medications as directed. Please refer to the Current Medication list given to you today.   Labwork: none  Testing/Procedures: Your physician has requested that you have an exercise tolerance test. For further information please visit [email or web address] Please also follow instruction sheet, as given.  Your physician has recommended that you wear a holter monitor. Holter monitors are medical devices that record the heart's electrical activity. Doctors most often use these monitors to diagnose arrhythmias. Arrhythmias are problems with the speed or rhythm of the heartbeat. The monitor is a small, portable device. You can wear one while you do your normal daily activities. This is usually used to diagnose what is causing palpitations/syncope (passing out).    Follow-Up: Your physician recommends that you schedule a follow-up appointment as needed.    Any Other Special Instructions Will Be Listed Below (If Applicable).     If you need a refill on your cardiac medications before your next appointment, please call your pharmacy.   Exercise Stress Electrocardiogram An exercise stress electrocardiogram is a test that is done to evaluate the blood supply to your heart. This test may also be called exercise stress electrocardiography. The test is done while you are walking on a treadmill. The goal of this test is to raise your heart rate. This test is done to find areas of poor blood flow to the heart by determining the extent of coronary artery disease (CAD).   CAD is defined as narrowing in one or more heart (coronary) arteries of more than 70%. If you have an abnormal test result, this may mean that you are not getting adequate blood flow to your heart during exercise. Additional testing may be needed to understand why your test was abnormal. LET [facility] PROVID[facility] KNOW ABOUT:   Any allergies you  have.  All medicines you are taking, including vitamins, herbs, eye drops, creams, and over-the-counter medicines.  Previous problems you or members of your family have had with the use of anesthetics.  Any blood disorders you have.  Previous surgeries you have had.  Medical conditions you have.  Possibility of pregnancy, if this applies. RISKS AND COMPLICATIONS Generally, this is a safe procedure. However, as with any procedure, complications can occur. Possible complications can include:  Pain or pressure in the following areas:  Chest.  Jaw or neck.  Between your shoulder blades.  Radiating down your left arm.  Dizziness or light-headedness.  Shortness of breath.  Increased or irregular heartbeats.  Nausea or vomiting.  Heart attack (rare). BEFORE THE PROCEDURE  Avoid all forms of caffeine 24 hours before your test or as directed by your health care provider. This includes coffee, tea (even decaffeinated tea), caffeinated sodas, chocolate, cocoa, and certain pain medicines.  Follow your health care provider's instructions regarding eating and drinking before the test.  Take your medicines as directed at regular times with water unless instructed otherwise. Exceptions may include:  If you have diabetes, ask how you are to take your insulin or pills. It is common to adjust insulin dosing the morning of the test.  If you are taking beta-blocker medicines, it is important to talk to your health care provider about these medicines well before the date of your test. Taking beta-blocker medicines may interfere with the test. In some cases, these medicines need to be changed or stopped 24 hours or more before the test.  If you wear a nitroglycerin patch, it may  need to be removed prior to the test. Ask your health care provider if the patch should be removed before the test.  If you use an inhaler for any breathing condition, bring it with you to the test.  If you are an  outpatient, bring a snack so you can eat right after the stress phase of the test.  Do not smoke for 4 hours prior to the test or as directed by your health care provider.  Do not apply lotions, powders, creams, or oils on your chest prior to the test.  Wear loose-fitting clothes and comfortable shoes for the test. This test involves walking on a treadmill. PROCEDURE  Multiple patches (electrodes) will be put on your chest. If needed, small areas of your chest may have to be shaved to get better contact with the electrodes. Once the electrodes are attached to your body, multiple wires will be attached to the electrodes and your heart rate will be monitored.  Your heart will be monitored both at rest and while exercising.  You will walk on a treadmill. The treadmill will be started at a slow pace. The treadmill speed and incline will gradually be increased to raise your heart rate. AFTER THE PROCEDURE  Your heart rate and blood pressure will be monitored after the test.  You may return to your normal schedule including diet, activities, and medicines, unless your health care provider tells you otherwise.   This information is not intended to replace advice given to you by your health care provider. Make sure you discuss any questions you have with your health care provider.   Document Released: 07/22/2000 Document Revised: 07/30/2013 Document Reviewed: 04/01/2013 Elsevier Interactive Patient Education 2016 Elsevier Inc.  Holter Monitoring A Holter monitor is a small device that is used to detect abnormal heart rhythms. It clips to your clothing and is connected by wires to flat, sticky disks (electrodes) that attach to your chest. It is worn continuously for 24-48 hours. HOME CARE INSTRUCTIONS  Wear your Holter monitor at all times, even while exercising and sleeping, for as long as directed by your health care provider.  Make sure that the Holter monitor is safely clipped to your  clothing or close to your body as recommended by your health care provider.  Do not get the monitor or wires wet.  Do not put body lotion or moisturizer on your chest.  Keep your skin clean.  Keep a diary of your daily activities, such as walking and doing chores. If you feel that your heartbeat is abnormal or that your heart is fluttering or skipping a beat:  Record what you are doing when it happens.  Record what time of day the symptoms occur.  Return your Holter monitor as directed by your health care provider.  Keep all follow-up visits as directed by your health care provider. This is important. SEEK IMMEDIATE MEDICAL CARE IF:  You feel lightheaded or you faint.  You have trouble breathing.  You feel pain in your chest, upper arm, or jaw.  You feel sick to your stomach and your skin is pale, cool, or damp.  You heartbeat feels unusual or abnormal.   This information is not intended to replace advice given to you by your health care provider. Make sure you discuss any questions you have with your health care provider.   Document Released: 04/22/2004 Document Revised: 08/15/2014 Document Reviewed: 03/03/2014 Elsevier Interactive Patient Education [employer]2016 Elsevier Inc.

## 2016-03-28 NOTE — Progress Notes (Signed)
Cardiology Office Note   Date:  03/28/2016   ID:  Jesus Butler, DOB 04/10/1986, MRN [id number]  PCP:  No PCP Per Patient  Cardiologist:   Lorine BearsMuhammad Khristi Schiller, MD   Chief Complaint  Patient presents with  . Other    [facility] due to rapid heart beats. Meds reviewed by the patient verbally. Pt. c/o cramping in legs at times, shortness of breath with rapid heart beats and has occas. fluttering sensation.       History of Present Illness: Jesus Butler is a [age] y.o. male who Was [facility] for evaluation of palpitations, shortness of breath and chest pain. The patient has no previous cardiac history and does not have any chronic medical conditions. He does not have a primary care physician and does not take any medications on a regular basis. He is a smoker and used to smoke cigarettes up until 2 years ago and after that he switch to nicotine-based electronic cigarettes. He reports family history of coronary artery disease on his mother's side. He denies any drug use or excessive alcohol use. 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 Last week on Wednesday while he was working, he developed sudden symptoms of palpitations associated with shortness of breath and substernal chest pain described as aching. He felt extremely hot. He was working in the hot weather and 92. His symptoms continued even after he rested and he started getting dizzy and lightheaded but did not have a syncopal episode. Thus, EMS were called. He was told by them that his heart rate was 174 bpm at that time but there is no documentation of that. By the time her prior to the emergency room his heart rate was below 100. He had labs done in the emergency room which overall were unremarkable including troponin and d-dimer. Chest x-ray showed no acute process. The patient denies previous similar symptoms. He was discharged home and resumed work after that. He had few  episodes of palpitations but no prolonged tachycardia. He doesn't feel completely back to normal.    Past Medical History:  Diagnosis Date  . Shoulder injury    left    Past Surgical History:  Procedure Laterality Date  . MULTIPLE TOOTH EXTRACTIONS    . SHOULDER ARTHROSCOPY Left 12/09/2015   Procedure: Arthroscopic subacromial decompression, left shoulder;  Surgeon: Christena FlakeJohn J Poggi, MD;  [facility];  Service: Orthopedics;  Laterality: Left;     No current outpatient prescriptions on file.   No current facility-administered medications for this visit.     Allergies:   Other and Voltaren [diclofenac]    Social History:  The patient  reports that he has been smoking E-cigarettes.  He has never used smokeless tobacco. He reports that he drinks alcohol. He reports that he does not use drugs.   Family History:  The patient's family history includes Heart disease in his mother; Hyperlipidemia in his mother; Hypertension in his mother.    ROS:  Please see the history of present illness.   Otherwise, review of systems are positive for none.   All other systems are reviewed and negative.    PHYSICAL EXAM: VS:  BP 106/78 (BP Location: Left Arm, Patient Position: Sitting, Cuff Size: Normal)   Pulse 75   Ht 5\' 10"  (1.778 m)   Wt 159 lb 12 oz (72.5 kg)   BMI 22.92 kg/m  , BMI Body mass index is  22.92 kg/m. GEN: Well nourished, well developed, in no acute distress  HEENT: normal  Neck: no JVD, carotid bruits, or masses Cardiac: RRR; no murmurs, rubs, or gallops,no edema  Respiratory:  clear to auscultation bilaterally, normal work of breathing GI: soft, nontender, nondistended, + BS MS: no deformity or atrophy  Skin: warm and dry, no rash Neuro:  Strength and sensation are intact Psych: euthymic mood, full affect   EKG:  EKG is ordered today. The ekg ordered today demonstrates  normal sinus rhythm with no significant ST or T wave changes.   Recent  Labs: 10/07/2015: ALT 14 03/23/2016: BUN 16; Creatinine, Ser 0.90; Hemoglobin 14.4; Platelets 231; Potassium 3.6; Sodium 139    Lipid Panel No results found for: CHOL, TRIG, HDL, CHOLHDL, VLDL, LDLCALC, LDLDIRECT    Wt Readings from Last 3 Encounters:  03/28/16 159 lb 12 oz (72.5 kg)  03/23/16 160 lb (72.6 kg)  12/14/15 149 lb (67.6 kg)       ASSESSMENT AND PLAN:  1.  Palpitations, shortness of breath and chest pain: The symptoms possibly could have been triggered by excessive heat. However, he continues to have intermittent palpitations and he doesn't feel completely back to normal. So far his workup, physical exam and EKG are all reassuring. I do not hear cardiac murmurs and there is nothing to suggest structural heart disease. Given his continued palpitations, I requested a 48-hour Holter monitor. I also requested a treadmill stress test. There might be a component of anxiety as well.  2. Tobacco use: I advised him to quit completely.    Disposition:   FU with me as needed.  Signed,  Lorine BearsMuhammad Aylee Littrell, MD  03/28/2016 3:43 PM    [facility]

## 2016-03-29 ENCOUNTER — Telehealth: Payer: Self-pay | Admitting: Cardiovascular Disease

## 2016-03-29 ENCOUNTER — Encounter (INDEPENDENT_AMBULATORY_CARE_PROVIDER_SITE_OTHER): Payer: Self-pay

## 2016-03-29 ENCOUNTER — Other Ambulatory Visit: Payer: Self-pay

## 2016-03-29 DIAGNOSIS — R002 Palpitations: Secondary | ICD-10-CM

## 2016-03-29 NOTE — Telephone Encounter (Signed)
Pt needs 48 holter monitor and is unable to have it placed in our office d/t work schedule. S/w Consuella LoseElaine at [employer]. Arranged for patient to have monitor placed at their location ([address]) today at 5:30pm. Faxed order to [phone]. Confirmed receipt with Consuella LoseElaine. Pt agreeable w/plan.

## 2016-04-07 ENCOUNTER — Telehealth: Payer: Self-pay | Admitting: Cardiovascular Disease

## 2016-04-07 NOTE — Telephone Encounter (Signed)
Per verbal from Dr. Kirke CorinArida, pt may resume work without restrictions. Informed pt who verbalized understanding. He will come by the office today for a letter to his employer. Letter placed at front desk.

## 2016-04-07 NOTE — Telephone Encounter (Signed)
Pt came into the office this morning inquiring of treadmill and HM results.  Stated he needs a letter before returning to work.  S/w pt on the phone who reports he had another "episode" at work yesterday. 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 He sat on ground to keep from falling. He had no other sx. His employer made him leave work and told he is unable to return until he is cleared by cardiology. Reviewed normal treadmill results w/pt.  S/w [facility] (monitor was placed at their location) who states results are "in process" and will be available to view today.  Informed pt awaiting results of monitor then will ask MD for clearance letter. He verbalized understanding w/no further questions at this time.

## 2016-04-07 NOTE — Telephone Encounter (Signed)
Patient came by and needs results of his treadmill and heart monitor. He also needs a letter stating he can go back to work with no limitations. He need this stat. He needs this by the end of the day.

## 2016-07-12 NOTE — Nursing Note (Signed)
Nursing Discharge Summary - Text       Nursing Discharge Summary Entered On:  07/12/2016 15:56 EST    Performed On:  07/12/2016 15:55 EST by Barrett, RN, Tresa Endo               DC Information   Discharge To, Anticipated :   Home independently   Mode of Discharge :   Ambulatory   Barrett, RNTresa Endo - 07/12/2016 15:55 EST

## 2016-10-10 ENCOUNTER — Emergency Department
Admission: EM | Admit: 2016-10-10 | Discharge: 2016-10-10 | Disposition: A | Discharge status: Home / Self Care | Payer: BLUE CROSS/BLUE SHIELD | Attending: Emergency Medicine | Admitting: Emergency Medicine

## 2016-10-10 ENCOUNTER — Emergency Department: Payer: BLUE CROSS/BLUE SHIELD

## 2016-10-10 ENCOUNTER — Encounter: Payer: Self-pay | Admitting: Emergency Medicine

## 2016-10-10 DIAGNOSIS — F17210 Nicotine dependence, cigarettes, uncomplicated: Secondary | ICD-10-CM | POA: Insufficient documentation

## 2016-10-10 DIAGNOSIS — S4991XA Unspecified injury of right shoulder and upper arm, initial encounter: Secondary | ICD-10-CM | POA: Diagnosis present

## 2016-10-10 DIAGNOSIS — X501XXA Overexertion from prolonged static or awkward postures, initial encounter: Secondary | ICD-10-CM | POA: Insufficient documentation

## 2016-10-10 DIAGNOSIS — Y939 Activity, unspecified: Secondary | ICD-10-CM | POA: Insufficient documentation

## 2016-10-10 DIAGNOSIS — Y999 Unspecified external cause status: Secondary | ICD-10-CM | POA: Diagnosis not present

## 2016-10-10 DIAGNOSIS — M25511 Pain in right shoulder: Secondary | ICD-10-CM | POA: Diagnosis not present

## 2016-10-10 DIAGNOSIS — Y929 Unspecified place or not applicable: Secondary | ICD-10-CM | POA: Insufficient documentation

## 2016-10-10 DIAGNOSIS — R52 Pain, unspecified: Secondary | ICD-10-CM

## 2016-10-10 MED ORDER — MELOXICAM 7.5 MG PO TABS: 7.5000 mg | ORAL_TABLET | Freq: Every day | ORAL | 2 refills | Status: AC

## 2016-10-10 NOTE — ED Provider Notes (Signed)
[facility] Emergency Department Provider Note  ____________________________________________  Time seen: Approximately 3:23 PM  I have reviewed the triage vital signs and the nursing notes.   HISTORY  Chief Complaint Shoulder Pain    HPI Jesus Butler is a 31 y.o. male presenting to the emergency department5 out of 10 right shoulder pain. Patient states that he was sitting on the couch when he stood up and "heard his shoulder pop". Patient states that he has experienced pain since. Pain has been severe enough that he "had to take a tramadol". Patient states that he had a left shoulder arthroscopy but denies surgery or prior trauma is to the right shoulder. Patient denies radiculopathy, weakness, neck pain or back pain. Patient has been afebrile. Patient takes no medications daily. Patient denies chest pain, chest tightness, shortness of breath, abdominal pain, nausea and vomiting.    Past Medical History:  Diagnosis Date  . Shoulder injury    left    There are no active problems to display for this patient.   Past Surgical History:  Procedure Laterality Date  . MULTIPLE TOOTH EXTRACTIONS    . SHOULDER ARTHROSCOPY Left 12/09/2015   Procedure: Arthroscopic subacromial decompression, left shoulder;  Surgeon: Christena Flake, MD;  Location: [facility];  Service: Orthopedics;  Laterality: Left;    Prior to Admission medications   Medication Sig Start Date End Date Taking? Authorizing Provider  meloxicam (MOBIC) 7.5 MG tablet Take 1 tablet (7.5 mg total) by mouth daily. 10/10/16 10/24/16  Orvil Feil, PA-C    Allergies Other and Voltaren [diclofenac]  Family History  Problem Relation Age of Onset  . Heart disease Mother     stent placement x 2   . Hyperlipidemia Mother   . Hypertension Mother     Social History Social History  Substance Use Topics  . Smoking status: Current Every Day Smoker    Types: E-cigarettes  . Smokeless tobacco:  Never Used     Comment: Quit smoking cigs about 2 yrs ago. Currently "vapes" 3-4x/day.  . Alcohol use Yes     Comment: may drink 4-5x/yr   Review of Systems  Constitutional: No fever/chills Eyes: No visual changes. No discharge ENT: No upper respiratory complaints. Cardiovascular: no chest pain. Respiratory: no cough. No SOB. Musculoskeletal: Patient has right shoulder pain.  Skin: Negative for rash, abrasions, lacerations, ecchymosis. Neurological: Negative for headaches, focal weakness or numbness. ____________________________________________   PHYSICAL EXAM:  VITAL SIGNS: ED Triage Vitals  Enc Vitals Group     BP 10/10/16 1331 125/71     Pulse Rate 10/10/16 1331 72     Resp 10/10/16 1331 20     Temp 10/10/16 1331 98.5 F (36.9 C)     Temp Source 10/10/16 1331 Oral     SpO2 10/10/16 1331 100 %     Weight 10/10/16 1332 165 lb (74.8 kg)     Height 10/10/16 1332 5\' 10"  (1.778 m)     Head Circumference --      Peak Flow --      Pain Score 10/10/16 1332 9     Pain Loc --      Pain Edu? --      Excl. in GC? --     Constitutional: Alert and oriented. Well appearing and in no acute distress. Neck: No cervical spine tenderness to palpation.No radiculopathy was elicited with range of motion testing of the neck. Hematological/Lymphatic/Immunilogical: No cervical lymphadenopathy. Cardiovascular: Normal rate, regular rhythm. Normal  S1 and S2.  Good peripheral circulation. Respiratory: Normal respiratory effort without tachypnea or retractions. Lungs CTAB. Good air entry to the bases with no decreased or absent breath sounds. Musculoskeletal: Patient has 5 out of 5 strength in the upper extremities bilaterally. Patient has full range of passive motion at the right shoulder. Patient has limited range of active motion at the right shoulder, likely secondary to pain. Right Shoulder: Patient has no tenderness elicited with palpation over the acromioclavicular joint. No pain is elicited  with cross body adduction. Patient has pain elicited with rotator cuff testing, but no weakness. No pain was elicited with palpation over the deltoid. Neurologic:  Normal speech and language. No gross focal neurologic deficits are appreciated. Reflexes are 2+ and symmetric in the upper extremities bilaterally. Skin:  Skin is warm, dry and intact. No rash noted. Psychiatric: Mood and affect are normal. Speech and behavior are normal. Patient exhibits appropriate insight and judgement. ____________________________________________   LABS (all labs ordered are listed, but only abnormal results are displayed)  Labs Reviewed - No data to display ____________________________________________  EKG   ____________________________________________  RADIOLOGY [name], personally viewed and evaluated these images (plain radiographs) as part of my medical decision making, as well as reviewing the written report by the radiologist.  Dg Shoulder Right  Result Date: 10/10/2016 CLINICAL DATA:  Right shoulder pain started yesterday EXAM: RIGHT SHOULDER - 2+ VIEW COMPARISON:  None. FINDINGS: There is no evidence of fracture or dislocation. There is no evidence of arthropathy or other focal bone abnormality. Soft tissues are unremarkable. IMPRESSION: No acute osseous injury of the right shoulder. Electronically Signed   By: Elige Ko   On: 10/10/2016 14:37    ____________________________________________    PROCEDURES  Procedure(s) performed:    Procedures    Medications - No data to display   ____________________________________________   INITIAL IMPRESSION / ASSESSMENT AND PLAN / ED COURSE  Pertinent labs & imaging results that were available during my care of the patient were reviewed by me and considered in my medical decision making (see chart for details).  Review of the [place] CSRS was performed in accordance of the [facility] prior to dispensing any controlled drugs.  Clinical  Course as of Oct 11 1631  Mon Oct 10, 2016  1400 Temp: 98.5 F (36.9 C) [JW]    Clinical Course User Index [JW] Orvil Feil, PA-C    Assessment and Plan: Right Shoulder Pain:  Patient presents to the emergency department with right shoulder pain. Patient denies a history of trauma. DG right shoulder reveals no acute fractures or dislocations. On physical exam, patient had pain with rotator cuff testing, which is concerning for rotator cuff pathology. Patient was discharged with Mobic. A referral was made to orthopedics, Dr. Hyacinth Meeker. Vital signs are reassuring at this time. All patient questions were answered.   ____________________________________________  FINAL CLINICAL IMPRESSION(S) / ED DIAGNOSES  Final diagnoses:  Pain  Acute pain of right shoulder      NEW MEDICATIONS STARTED DURING THIS VISIT:  Discharge Medication List as of 10/10/2016  3:27 PM    START taking these medications   Details  meloxicam (MOBIC) 7.5 MG tablet Take 1 tablet (7.5 mg total) by mouth daily., Starting Mon 10/10/2016, Until Mon 10/24/2016, Print            This chart was dictated using voice recognition software/Dragon. Despite best efforts to proofread, errors can occur which can change the meaning. Any change was  purely unintentional.    Orvil FeilJaclyn M Lerry Cordrey, PA-C 10/10/16 1633    Jene Everyobert Kinner, MD 10/11/16 [phone]

## 2016-10-10 NOTE — ED Triage Notes (Signed)
Pt states he felt a pop in his right shoulder yesterday. LROM.

## 2016-10-10 NOTE — ED Notes (Signed)
Pt reports that he has pain in right shoulder that started yesterday - he heard a "pop" in his right arm and since then has had continuous pain - denies injury

## 2016-12-08 ENCOUNTER — Emergency Department
Admission: EM | Admit: 2016-12-08 | Discharge: 2016-12-08 | Disposition: A | Discharge status: Home / Self Care | Payer: BLUE CROSS/BLUE SHIELD | Attending: Emergency Medicine | Admitting: Emergency Medicine

## 2016-12-08 ENCOUNTER — Emergency Department: Payer: BLUE CROSS/BLUE SHIELD

## 2016-12-08 ENCOUNTER — Encounter: Payer: Self-pay | Admitting: Emergency Medicine

## 2016-12-08 DIAGNOSIS — S20221A Contusion of right back wall of thorax, initial encounter: Secondary | ICD-10-CM | POA: Insufficient documentation

## 2016-12-08 DIAGNOSIS — S299XXA Unspecified injury of thorax, initial encounter: Secondary | ICD-10-CM | POA: Diagnosis present

## 2016-12-08 DIAGNOSIS — Z87891 Personal history of nicotine dependence: Secondary | ICD-10-CM | POA: Insufficient documentation

## 2016-12-08 DIAGNOSIS — Y929 Unspecified place or not applicable: Secondary | ICD-10-CM | POA: Insufficient documentation

## 2016-12-08 DIAGNOSIS — Y990 Civilian activity done for income or pay: Secondary | ICD-10-CM | POA: Insufficient documentation

## 2016-12-08 DIAGNOSIS — W208XXA Other cause of strike by thrown, projected or falling object, initial encounter: Secondary | ICD-10-CM | POA: Insufficient documentation

## 2016-12-08 DIAGNOSIS — M62830 Muscle spasm of back: Secondary | ICD-10-CM

## 2016-12-08 DIAGNOSIS — Y9389 Activity, other specified: Secondary | ICD-10-CM | POA: Insufficient documentation

## 2016-12-08 LAB — URINALYSIS, COMPLETE (UACMP) WITH MICROSCOPIC
BACTERIA UA: NONE SEEN
BILIRUBIN URINE: NEGATIVE
Glucose, UA: NEGATIVE mg/dL
HGB URINE DIPSTICK: NEGATIVE
KETONES UR: NEGATIVE mg/dL
LEUKOCYTES UA: NEGATIVE
NITRITE: NEGATIVE
Protein, ur: NEGATIVE mg/dL
SPECIFIC GRAVITY, URINE: 1.02 (ref 1.005–1.030)
Squamous Epithelial / LPF: NONE SEEN
pH: 6 (ref 5.0–8.0)

## 2016-12-08 MED ORDER — CYCLOBENZAPRINE HCL 5 MG PO TABS: 5.0000 mg | ORAL_TABLET | Freq: Three times a day (TID) | ORAL | 0 refills | Status: DC | PRN

## 2016-12-08 NOTE — ED Provider Notes (Signed)
[facility] Emergency Department Provider Note ____________________________________________  Time seen: 1345  I have reviewed the triage vital signs and the nursing notes.  HISTORY  Chief Complaint  Back Pain  HPI Jesus Butler is a 31 y.o. male presents to the ED for evaluation of injury sustained at work yesterday. Patient describes he was cutting a large stone, when a limb fell free from the stomach, falling on the patient. He describes a large limb fell primarily hitting him on the lower back and knocking the wind out of him. He was evaluated by EMS on the scene, and refused transport. He left work early at the advice of his supervisor's. He presents today for evaluation of some ongoing back pain. He describes 1 episode of seening a small amount of blood in his urine last night, but reports normal urination without gross hematuria since that time. He denies any distal paresthesias, foot drop, incontinence, or weakness. He also denies any other injury sustained including head injury, loss of consciousness, or laceration. He is not taking any medication in the interim for pain relief. He is here primarily looking for an x-ray for his injury he describes pain as minimal with sitting or lying still. He reports some increased pain with transitioning from supine to sit and sit to stand.  Past Medical History:  Diagnosis Date  . Shoulder injury    left    There are no active problems to display for this patient.   Past Surgical History:  Procedure Laterality Date  . MULTIPLE TOOTH EXTRACTIONS    . SHOULDER ARTHROSCOPY Left 12/09/2015   Procedure: Arthroscopic subacromial decompression, left shoulder;  Surgeon: Christena Flake, MD;  Location: [facility];  Service: Orthopedics;  Laterality: Left;    Prior to Admission medications   Medication Sig Start Date End Date Taking? Authorizing Provider  cyclobenzaprine (FLEXERIL) 5 MG tablet Take 1 tablet (5 mg total)  by mouth 3 (three) times daily as needed for muscle spasms. 12/08/16   Emyah Roznowski V Bacon Tore Carreker, PA-C    Allergies Other and Voltaren [diclofenac]  Family History  Problem Relation Age of Onset  . Heart disease Mother     stent placement x 2   . Hyperlipidemia Mother   . Hypertension Mother     Social History Social History  Substance Use Topics  . Smoking status: Former Smoker    Types: E-cigarettes  . Smokeless tobacco: Never Used     Comment: Quit smoking cigs about 2 yrs ago. Currently "vapes" 3-4x/day.  . Alcohol use Yes     Comment: may drink 4-5x/yr    Review of Systems  Constitutional: Negative for fever. Cardiovascular: Negative for chest pain. Respiratory: Negative for shortness of breath. Gastrointestinal: Negative for abdominal pain, vomiting and diarrhea. Genitourinary: Negative for dysuria. Musculoskeletal: Positive for back pain. Skin: Negative for rash. Neurological: Negative for headaches, focal weakness or numbness. ____________________________________________  PHYSICAL EXAM:  VITAL SIGNS: ED Triage Vitals  Enc Vitals Group     BP 12/08/16 1223 128/68     Pulse Rate 12/08/16 1223 75     Resp 12/08/16 1223 16     Temp 12/08/16 1223 98.3 F (36.8 C)     Temp Source 12/08/16 1223 Oral     SpO2 12/08/16 1223 97 %     Weight 12/08/16 1224 172 lb (78 kg)     Height 12/08/16 1224 5\' 10"  (1.778 m)     Head Circumference --  Peak Flow --      Pain Score 12/08/16 1223 3     Pain Loc --      Pain Edu? --      Excl. in GC? --     Constitutional: Alert and oriented. Well appearing and in no distress. Head: Normocephalic and atraumatic. Eyes: Conjunctivae are normal. PERRL. Normal extraocular movements Cardiovascular: Normal rate, regular rhythm. Normal distal pulses. Respiratory: Normal respiratory effort. No wheezes/rales/rhonchi. Gastrointestinal: Soft and nontender. No distention. Musculoskeletal: Normal spinal alignment without midline  tenderness, spasm, deformity, or step-off. Patient without any obvious deformity, bruise, ecchymosis, or abrasion noted to the back. Nontender with normal range of motion in all extremities.  Neurologic: Cranial nerves II through XII grossly intact. Normal gait without ataxia. Normal speech and language. No gross focal neurologic deficits are appreciated. Skin:  Skin is warm, dry and intact. No rash noted. Psychiatric: Mood and affect are normal. Patient exhibits appropriate insight and judgment. ____________________________________________   LABS (pertinent positives/negatives)  Labs Reviewed  URINALYSIS, COMPLETE (UACMP) WITH MICROSCOPIC - Abnormal; Notable for the following:       Result Value   Color, Urine YELLOW (*)    APPearance CLEAR (*)    All other components within normal limits  ____________________________________________   RADIOLOGY  Lumbar Spine IMPRESSION: Negative.  I, Ibrahim Mcpheeters, Charlesetta IvoryJenise V Bacon, personally viewed and evaluated these images (plain radiographs) as part of my medical decision making, as well as reviewing the written report by the radiologist. ____________________________________________  INITIAL IMPRESSION / ASSESSMENT AND PLAN / ED COURSE  Patient with a ED visit for evaluation of work-related injuries. He is reassured by his negative urinalysis and negative x-ray. He is discharged at this time with a diagnosis of back contusion and muscle cramps. He will take a prescription for Flexeril dose as needed for muscle spasm. He was otherwise dosed ibuprofen for nondrowsy pain relief. He is advised to follow-up with [facility] or his companies medical provider as needed. A work note is provided returning him to work tomorrow as requested. ____________________________________________  FINAL CLINICAL IMPRESSION(S) / ED DIAGNOSES  Final diagnoses:  Contusion of right side of back, initial encounter  Muscle spasm of back      Lissa HoardJenise V Bacon Crispin Vogel,  PA-C 12/08/16 1723    Rockne MenghiniNorman, Anne-Caroline, MD 12/12/16 [phone]

## 2016-12-08 NOTE — ED Triage Notes (Addendum)
Pt to ED via POV with c/o lower/mid back pain after injury at work yesterday. Pt states tree limb fell on pt back, denies any LOC , states " knocked the wind out of me". Pt denies WC at this time. Pt also reports seeing small amount of blood in his urine last night but none today.

## 2016-12-08 NOTE — Discharge Instructions (Signed)
Your exam and x-ray are normal following your accident. You should take ibuprofen for non-drowsy pain and inflammation relief. Take the muscle relaxant as needed for muscle spasms. [facility] or return as needed.

## 2017-02-10 DIAGNOSIS — M722 Plantar fascial fibromatosis: Secondary | ICD-10-CM

## 2017-02-26 ENCOUNTER — Emergency Department
Admission: EM | Admit: 2017-02-26 | Discharge: 2017-02-26 | Disposition: A | Discharge status: Home / Self Care | Payer: BLUE CROSS/BLUE SHIELD | Attending: Emergency Medicine | Admitting: Emergency Medicine

## 2017-02-26 ENCOUNTER — Emergency Department: Payer: BLUE CROSS/BLUE SHIELD

## 2017-02-26 ENCOUNTER — Encounter: Payer: Self-pay | Admitting: Emergency Medicine

## 2017-02-26 DIAGNOSIS — S66801A Unspecified injury of other specified muscles, fascia and tendons at wrist and hand level, right hand, initial encounter: Secondary | ICD-10-CM | POA: Diagnosis not present

## 2017-02-26 DIAGNOSIS — S6991XA Unspecified injury of right wrist, hand and finger(s), initial encounter: Secondary | ICD-10-CM | POA: Diagnosis present

## 2017-02-26 DIAGNOSIS — Y929 Unspecified place or not applicable: Secondary | ICD-10-CM | POA: Diagnosis not present

## 2017-02-26 DIAGNOSIS — F17290 Nicotine dependence, other tobacco product, uncomplicated: Secondary | ICD-10-CM | POA: Diagnosis not present

## 2017-02-26 DIAGNOSIS — Y999 Unspecified external cause status: Secondary | ICD-10-CM | POA: Insufficient documentation

## 2017-02-26 DIAGNOSIS — Y9389 Activity, other specified: Secondary | ICD-10-CM | POA: Insufficient documentation

## 2017-02-26 DIAGNOSIS — W208XXA Other cause of strike by thrown, projected or falling object, initial encounter: Secondary | ICD-10-CM | POA: Insufficient documentation

## 2017-02-26 DIAGNOSIS — R52 Pain, unspecified: Secondary | ICD-10-CM

## 2017-02-26 NOTE — Discharge Instructions (Signed)
Call tomorrow morning. The phone number is [phone]. Then dial extension [phone] for Jesus Butler. If he does not answer leave your name and phone number so that he can contact you with a time on Tuesday. He will get use an appointment Tuesday to be seen by the hand specialist.

## 2017-02-26 NOTE — ED Notes (Signed)
Pt reports that he hurt right 5th finger - he states when he was putting wood in a chipper he had a piece of wood to hit his finger and bend it (this occurred Monday - pt states he is unable to bend finger at the knuckle joints

## 2017-02-26 NOTE — ED Provider Notes (Signed)
[facility] Emergency Department Provider Note  ____________________________________________   First MD Initiated Contact with Patient 02/26/17 1359     (approximate)  I have reviewed the triage vital signs and the nursing notes.   HISTORY  Chief Complaint Finger Injury    HPI Jesus Butler is a 31 y.o. male is here with complaint of right fifth finger pain. Patient states that he was placing a piece of wood into a chipper and the end of his finger was hit with the wood. Patient states that his finger was forced dorsally very quickly. Patient states that his finger has continued to be swollen. This accident happened approximately 6 days ago.. Patient currently denies any pain. Because he is unable to move his finger he is concerned that it is broken.   Past Medical History:  Diagnosis Date  . Shoulder injury    left    There are no active problems to display for this patient.   Past Surgical History:  Procedure Laterality Date  . MULTIPLE TOOTH EXTRACTIONS    . SHOULDER ARTHROSCOPY Left 12/09/2015   Procedure: Arthroscopic subacromial decompression, left shoulder;  Surgeon: Christena Flake, MD;  Location: [facility];  Service: Orthopedics;  Laterality: Left;    Prior to Admission medications   Not on File    Allergies Other and Voltaren [diclofenac]  Family History  Problem Relation Age of Onset  . Heart disease Mother        stent placement x 2   . Hyperlipidemia Mother   . Hypertension Mother     Social History Social History  Substance Use Topics  . Smoking status: Former Smoker    Types: E-cigarettes  . Smokeless tobacco: Never Used     Comment: Quit smoking cigs about 2 yrs ago. Currently "vapes" 3-4x/day.  . Alcohol use Yes     Comment: may drink 4-5x/yr    Review of Systems  Constitutional: No fever/chills Cardiovascular: Denies chest pain. Respiratory: Denies shortness of breath. Musculoskeletal: Pain right  fifth finger. Skin: Negative for rash. Neurological: Negative for  focal weakness or numbness.   ____________________________________________   PHYSICAL EXAM:  VITAL SIGNS: ED Triage Vitals  Enc Vitals Group     BP 02/26/17 1242 117/73     Pulse Rate 02/26/17 1242 64     Resp 02/26/17 1242 16     Temp 02/26/17 1242 98.5 F (36.9 C)     Temp Source 02/26/17 1242 Oral     SpO2 02/26/17 1242 100 %     Weight --      Height --      Head Circumference --      Peak Flow --      Pain Score 02/26/17 1240 4     Pain Loc --      Pain Edu? --      Excl. in GC? --     Constitutional: Alert and oriented. Well appearing and in no acute distress. Eyes: Conjunctivae are normal.  Head: Atraumatic. Nose: No congestion/rhinnorhea. Neck: No stridor.   Cardiovascular: Normal rate, regular rhythm. Grossly normal heart sounds.  Good peripheral circulation. Respiratory: Normal respiratory effort.  No retractions. Lungs CTAB. Musculoskeletal: Examination of the right fifth finger there is moderate swelling at the PIP joint and tenderness. Skin is intact. Capillary refill is less than 3 seconds. Patient is able to feel touch grossly. Patient is unable to flex his finger.  Neurologic:  Normal speech and language. No gross focal  neurologic deficits are appreciated. No gait instability. Skin:  Skin is warm, dry and intact. No rash noted. Psychiatric: Mood and affect are normal. Speech and behavior are normal.  ____________________________________________   LABS (all labs ordered are listed, but only abnormal results are displayed)  Labs Reviewed - No data to display  RADIOLOGY  Dg Finger Little Right  Result Date: 02/26/2017 CLINICAL DATA:  Injury to right fifth finger. EXAM: RIGHT LITTLE FINGER 2+V COMPARISON:  None. FINDINGS: Evidence of an old fifth metacarpal fracture. Remainder of the exam is unremarkable. IMPRESSION: No acute findings. Electronically Signed   By: Elberta Fortisaniel  Boyle M.D.    On: 02/26/2017 14:17    ____________________________________________   PROCEDURES  Procedure(s) performed: None  Procedures  Critical Care performed: No  ____________________________________________   INITIAL IMPRESSION / ASSESSMENT AND PLAN / ED COURSE  Pertinent labs & imaging results that were available during my care of the patient were reviewed by me and considered in my medical decision making (see chart for details).  Patient was again tried to flex his finger and was unable. X-ray determined that there was no fracture or dislocation. I explained to patient that most likely he has a flexor tendon tear and would need to see the orthopedist. Patient refused having a splint put on his finger or buddy taping his fifth finger to his fourth finger because it would be a "snag hazard" and he would not be able to work. Patient states that there is no way he can miss work and that this was not a workman's comp case. Patient was given Dr. Samuel GermanyKrasinski's phone number as a referral along with [facility] so that he can have an appointment this Tuesday with the hand specialist that will be there. Patient is aware that this will not heal on its own.      ____________________________________________   FINAL CLINICAL IMPRESSION(S) / ED DIAGNOSES  Final diagnoses:  Pain  Injury of flexor tendon of hand, right, initial encounter      NEW MEDICATIONS STARTED DURING THIS VISIT:  Discharge Medication List as of 02/26/2017  2:54 PM       Note:  This document was prepared using Dragon voice recognition software and may include unintentional dictation errors.    Tommi RumpsSummers, Rhonda L, PA-C 02/26/17 1505    Arnaldo NatalMalinda, Paul F, MD 02/26/17 [phone]

## 2017-02-26 NOTE — ED Triage Notes (Signed)
Pt states that he injured his pinky on Monday, thinks that it may be broken, would like x-ray

## 2017-05-02 ENCOUNTER — Emergency Department
Admission: EM | Admit: 2017-05-02 | Discharge: 2017-05-02 | Disposition: A | Discharge status: Home / Self Care | Payer: BLUE CROSS/BLUE SHIELD | Attending: Emergency Medicine | Admitting: Emergency Medicine

## 2017-05-02 DIAGNOSIS — Y92096 Garden or yard of other non-institutional residence as the place of occurrence of the external cause: Secondary | ICD-10-CM | POA: Insufficient documentation

## 2017-05-02 DIAGNOSIS — Y93H2 Activity, gardening and landscaping: Secondary | ICD-10-CM | POA: Insufficient documentation

## 2017-05-02 DIAGNOSIS — Y999 Unspecified external cause status: Secondary | ICD-10-CM | POA: Insufficient documentation

## 2017-05-02 DIAGNOSIS — Z87891 Personal history of nicotine dependence: Secondary | ICD-10-CM | POA: Insufficient documentation

## 2017-05-02 DIAGNOSIS — S0502XA Injury of conjunctiva and corneal abrasion without foreign body, left eye, initial encounter: Secondary | ICD-10-CM | POA: Insufficient documentation

## 2017-05-02 DIAGNOSIS — X58XXXA Exposure to other specified factors, initial encounter: Secondary | ICD-10-CM | POA: Insufficient documentation

## 2017-05-02 MED ORDER — ERYTHROMYCIN 5 MG/GM OP OINT: TOPICAL_OINTMENT | Freq: Three times a day (TID) | OPHTHALMIC | 0 refills | Status: AC

## 2017-05-02 MED ORDER — FLUORESCEIN SODIUM 0.6 MG OP STRP
1.0000 | ORAL_STRIP | Freq: Once | OPHTHALMIC | Status: AC
  Administered 2017-05-02: 1 via OPHTHALMIC
  Filled 2017-05-02: qty 1

## 2017-05-02 MED ORDER — TETRACAINE HCL 0.5 % OP SOLN
2.0000 [drp] | Freq: Once | OPHTHALMIC | Status: AC
  Administered 2017-05-02: 2 [drp] via OPHTHALMIC
  Filled 2017-05-02: qty 4

## 2017-05-02 NOTE — ED Notes (Signed)
Eye irrigated per order. Pt tolerated well.

## 2017-05-02 NOTE — ED Triage Notes (Signed)
Pt states since Saturday has had redness and irritation to left eye, no known injury. States has flushed it at home without relief.

## 2017-05-02 NOTE — ED Provider Notes (Signed)
[facility] Emergency Department Provider Note  ____________________________________________  Time seen: Approximately 7:36 AM  I have reviewed the triage vital signs and the nursing notes.   HISTORY  Chief Complaint Eye Pain    HPI Jesus Butler is a 31 y.o. male that presents to the emergency department for evaluation of left eye pain for 3 days. Patient was in the grass mowing and does not recall getting anything in his eye, but pain started shortly after. He describes the pain as burning. He has tried flushing his eye without relief. No trauma. He has allergies, that were worse this morning. He does not wear contacts or glasses. No headache, double vision, floaters, flashers, photophobia, nausea, vomiting.   Past Medical History:  Diagnosis Date  . Shoulder injury    left    There are no active problems to display for this patient.   Past Surgical History:  Procedure Laterality Date  . MULTIPLE TOOTH EXTRACTIONS    . SHOULDER ARTHROSCOPY Left 12/09/2015   Procedure: Arthroscopic subacromial decompression, left shoulder;  Surgeon: Christena Flake, MD;  Location: [facility];  Service: Orthopedics;  Laterality: Left;    Prior to Admission medications   Medication Sig Start Date End Date Taking? Authorizing Provider  erythromycin [facility]) ophthalmic ointment Place into the right eye 3 (three) times daily. Place a 1/2 inch ribbon of ointment into the lower eyelid. 05/02/17 05/12/17  Enid Derry, PA-C    Allergies Other and Voltaren [diclofenac]  Family History  Problem Relation Age of Onset  . Heart disease Mother        stent placement x 2   . Hyperlipidemia Mother   . Hypertension Mother     Social History Social History  Substance Use Topics  . Smoking status: Former Smoker    Types: E-cigarettes  . Smokeless tobacco: Never Used     Comment: Quit smoking cigs about 2 yrs ago. Currently "vapes" 3-4x/day.  . Alcohol use Yes      Comment: may drink 4-5x/yr     Review of Systems  Constitutional: No fever/chills Cardiovascular: No chest pain. Respiratory: No SOB. Gastrointestinal: No abdominal pain.  No nausea, no vomiting.  Musculoskeletal: Negative for musculoskeletal pain. Skin: Negative for rash, abrasions, lacerations, ecchymosis. Neurological: Negative for headaches, numbness or tingling   ____________________________________________   PHYSICAL EXAM:  VITAL SIGNS: ED Triage Vitals  Enc Vitals Group     BP 05/02/17 0645 121/72     Pulse Rate 05/02/17 0645 75     Resp 05/02/17 0645 20     Temp 05/02/17 0645 97.6 F (36.4 C)     Temp Source 05/02/17 0645 Oral     SpO2 05/02/17 0645 100 %     Weight 05/02/17 0644 167 lb (75.8 kg)     Height 05/02/17 0644  (1.778 m)     Head Circumference --      Peak Flow --      Pain Score 05/02/17 0644 0     Pain Loc --      Pain Edu? --      Excl. in GC? --      Constitutional: Alert and oriented. Well appearing and in no acute distress. Eyes: Left eye is injected. PERRL. EOMI. 1 mm defect at bottom of the iris on fluorescence stain. No surrounding swelling or erythema. Head: Atraumatic. ENT:      Ears:      Nose: No congestion/rhinnorhea.      Mouth/Throat:  Mucous membranes are moist.  Neck: No stridor.   Cardiovascular: Good peripheral circulation. Respiratory: Normal respiratory effort without tachypnea or retractions.  Good air entry to the bases with no decreased or absent breath sounds. Musculoskeletal: Full range of motion to all extremities. No gross deformities appreciated. Neurologic:  Normal speech and language. No gross focal neurologic deficits are appreciated.  Skin:  Skin is warm, dry and intact. No rash noted.   ____________________________________________   LABS (all labs ordered are listed, but only abnormal results are displayed)  Labs Reviewed - No data to  display ____________________________________________  EKG   ____________________________________________  RADIOLOGY   No results found.  ____________________________________________    PROCEDURES  Procedure(s) performed:    Procedures    Medications  fluorescein ophthalmic strip 1 strip (1 strip Left Eye Given 05/02/17 0757)  tetracaine (PONTOCAINE) 0.5 % ophthalmic solution 2 drop (2 drops Left Eye Given 05/02/17 0756)     ____________________________________________   INITIAL IMPRESSION / ASSESSMENT AND PLAN / ED COURSE  Pertinent labs & imaging results that were available during my care of the patient were reviewed by me and considered in my medical decision making (see chart for details).  Review of the [place] CSRS was performed in accordance of the [facility] prior to dispensing any controlled drugs.  Patient's diagnosis is consistent with corneal abrasion. Vital signs and exam are reassuring. Defect seen on fluorescein stain. Patient will be discharged home with prescriptions for erythromycin ointment. Patient is to follow up with  eye as directed. Patient is given ED precautions to return to the ED for any worsening or new symptoms.     ____________________________________________  FINAL CLINICAL IMPRESSION(S) / ED DIAGNOSES  Final diagnoses:  Abrasion of left cornea, initial encounter      NEW MEDICATIONS STARTED DURING THIS VISIT:  New Prescriptions   ERYTHROMYCIN (ROMYCIN) OPHTHALMIC OINTMENT    Place into the right eye 3 (three) times daily. Place a 1/2 inch ribbon of ointment into the lower eyelid.        This chart was dictated using voice recognition software/Dragon. Despite best efforts to proofread, errors can occur which can change the meaning. Any change was purely unintentional.    Enid Derry, PA-C 05/02/17 0845    Sharyn Creamer, MD 05/02/17 [id number]

## 2017-09-23 ENCOUNTER — Emergency Department
Admission: EM | Admit: 2017-09-23 | Discharge: 2017-09-23 | Disposition: A | Discharge status: Home / Self Care | Payer: PRIVATE HEALTH INSURANCE | Attending: Emergency Medicine | Admitting: Emergency Medicine

## 2017-09-23 ENCOUNTER — Emergency Department: Payer: PRIVATE HEALTH INSURANCE

## 2017-09-23 ENCOUNTER — Encounter: Payer: Self-pay | Admitting: Emergency Medicine

## 2017-09-23 ENCOUNTER — Other Ambulatory Visit: Payer: Self-pay

## 2017-09-23 DIAGNOSIS — Z87891 Personal history of nicotine dependence: Secondary | ICD-10-CM | POA: Insufficient documentation

## 2017-09-23 DIAGNOSIS — R079 Chest pain, unspecified: Secondary | ICD-10-CM | POA: Insufficient documentation

## 2017-09-23 LAB — BASIC METABOLIC PANEL
Anion gap: 7 (ref 5–15)
BUN: 23 mg/dL — AB (ref 6–20)
CHLORIDE: 103 mmol/L (ref 101–111)
CO2: 28 mmol/L (ref 22–32)
Calcium: 9.4 mg/dL (ref 8.9–10.3)
Creatinine, Ser: 1.13 mg/dL (ref 0.61–1.24)
GFR calc Af Amer: >60 mL/min (ref >60)
GFR calc non Af Amer: >60 mL/min (ref >60)
GLUCOSE: 129 mg/dL — AB (ref 65–99)
Potassium: 4.4 mmol/L (ref 3.5–5.1)
Sodium: 138 mmol/L (ref 135–145)

## 2017-09-23 LAB — CBC
HCT: 41.7 % (ref 40.0–52.0)
Hemoglobin: 14.5 g/dL (ref 13.0–18.0)
MCH: 30.7 pg (ref 26.0–34.0)
MCHC: 34.8 g/dL (ref 32.0–36.0)
MCV: 88.2 fL (ref 80.0–100.0)
Platelets: 258 10*3/uL (ref 150–440)
RBC: 4.73 MIL/uL (ref 4.40–5.90)
RDW: 12.7 % (ref 11.5–14.5)
WBC: 8.7 10*3/uL (ref 3.8–10.6)

## 2017-09-23 LAB — TROPONIN I: Troponin I: <0.03 ng/mL (ref <0.03)

## 2017-09-23 NOTE — ED Notes (Signed)
Patient called for room times two with no answer. 

## 2017-09-23 NOTE — ED Notes (Addendum)
Pt states he has had chest pain since night before last. Says it felt like pressure and burning, along with HA, coughing, vomiting, and constipation. Pt also says he is always cold and cannot "get warm".

## 2017-09-23 NOTE — ED Notes (Signed)
Pt answered on 3rd call.

## 2017-09-23 NOTE — ED Provider Notes (Signed)
[facility] Emergency Department Provider Note ____________________________________________   I have reviewed the triage vital signs and the triage nursing note.  HISTORY  Chief Complaint Chest Pain   Historian Patient  HPI Jesus Butler is a 32 y.o. male presenting for central chest discomfort that spent ongoing and constant since Thursday.  No trauma or known overuse injury.  Patient states that he has had occasional chest discomfort in the past, but nothing that felt like this.  This feels like burning and is located in the mid sternum and then down almost to the epigastrium.  He does not think that he is having GERD because his girlfriend has GERD and he does not like he has those symptoms.  Reports constipation.  He states he has a mild dry cough without any fevers.  No chest pain.  No palpitations.  Patient states he does not have a primary care doctor.  States he smoked from when he was 32 years old until he was 23, but he is now quit since then.  States he lost his job on Friday, but he made a couple of phone calls and got it even better job and so he does not feel stressed out at all.  He has multiple other complaints regarding years of monitoring his urine.  He states that when he drinks water his urine looks dark.  He states that when he drinks soda his urine looks clear.  He is wondering what is going on with that.  He states he eats mostly meat, and that he feels like he is increased his fluid intake over the past 3 days, but has not increased urine output and is wondering about that.   Past Medical History:  Diagnosis Date  . Shoulder injury    left    There are no active problems to display for this patient.   Past Surgical History:  Procedure Laterality Date  . MULTIPLE TOOTH EXTRACTIONS    . SHOULDER ARTHROSCOPY Left 12/09/2015   Procedure: Arthroscopic subacromial decompression, left shoulder;  Surgeon: Christena Flake, MD;   Location: [facility];  Service: Orthopedics;  Laterality: Left;    Prior to Admission medications   Not on File    Allergies  Allergen Reactions  . Other Hives and Itching    Carpet mold  . Voltaren [Diclofenac]     Pt reports rectal bleed    Family History  Problem Relation Age of Onset  . Heart disease Mother        stent placement x 2   . Hyperlipidemia Mother   . Hypertension Mother     Social History Social History   Tobacco Use  . Smoking status: Former Smoker    Types: E-cigarettes  . Smokeless tobacco: Never Used  . Tobacco comment: Quit smoking cigs about 2 yrs ago. Currently "vapes" 3-4x/day.  Substance Use Topics  . Alcohol use: Yes    Comment: may drink 4-5x/yr  . Drug use: No    Review of Systems  Constitutional: Negative for fever. Eyes: Negative for visual changes. ENT: Negative for sore throat. Cardiovascular: Positive as per HPI for chest pain. Respiratory: Negative for shortness of breath. Gastrointestinal: Negative for abdominal pain, vomiting and diarrhea. Genitourinary: Negative for dysuria. Musculoskeletal: Negative for back pain. Skin: Negative for rash. Neurological: Negative for headache.  ____________________________________________   PHYSICAL EXAM:  VITAL SIGNS: ED Triage Vitals  Enc Vitals Group     BP 09/23/17 0140 135/85  Pulse Rate 09/23/17 0140 77     Resp 09/23/17 0140 18     Temp 09/23/17 0140 98.3 F (36.8 C)     Temp Source 09/23/17 0140 Oral     SpO2 09/23/17 0140 100 %     Weight 09/23/17 0140 175 lb (79.4 kg)     Height 09/23/17 0140 5' 10.5" (1.791 m)     Head Circumference --      Peak Flow --      Pain Score 09/23/17 0200 7     Pain Loc --      Pain Edu? --      Excl. in GC? --      Constitutional: Alert and oriented. Well appearing and in no distress. HEENT   Head: Normocephalic and atraumatic.      Eyes: Conjunctivae are normal. Pupils equal and round.       Ears:         Nose:  No congestion/rhinnorhea.   Mouth/Throat: Mucous membranes are moist.   Neck: No stridor. Cardiovascular/Chest: Normal rate, regular rhythm.  No murmurs, rubs, or gallops. Respiratory: Normal respiratory effort without tachypnea nor retractions. Breath sounds are clear and equal bilaterally. No wheezes/rales/rhonchi. Gastrointestinal: Soft. No distention, no guarding, no rebound. Nontender.    Genitourinary/rectal:Deferred Musculoskeletal: Nontender with normal range of motion in all extremities. No joint effusions.  No lower extremity tenderness.  No edema. Neurologic:  Normal speech and language. No gross or focal neurologic deficits are appreciated. Skin:  Skin is warm, dry and intact. No rash noted. Psychiatric: Mood and affect are normal. Speech and behavior are normal. Patient exhibits appropriate insight and judgment.   ____________________________________________  LABS (pertinent positives/negatives) I, Governor Rooksebecca Darcel Frane, MD the attending physician have reviewed the labs noted below.  Labs Reviewed  BASIC METABOLIC PANEL - Abnormal; Notable for the following components:      Result Value   Glucose, Bld 129 (*)    BUN 23 (*)    All other components within normal limits  CBC  TROPONIN I    ____________________________________________    EKG I, Governor Rooksebecca Zamar Odwyer, MD, the attending physician have personally viewed and interpreted all ECGs.  70 bpm.  Normal sinus rhythm.  Narrow QRS.  Normal axis.  Normal ST and T wave ____________________________________________  RADIOLOGY All Xrays were viewed by me.  Imaging interpreted by Radiologist, and I, Governor Rooksebecca Corrisa Gibby, MD the attending physician have reviewed the radiologist interpretation noted below.  Chest x-ray no acute findings  Radiologist report:  IMPRESSION: Unremarkable radiographs of the chest. __________________________________________  PROCEDURES  Procedure(s) performed: None  Critical Care performed:  None   ____________________________________________  ED COURSE / ASSESSMENT AND PLAN  Pertinent labs & imaging results that were available during my care of the patient were reviewed by me and considered in my medical decision making (see chart for details).   Patient describes burning into the mid chest, which is actually eased off now compared to what it was.  It has been relatively constant over the last day and a half.  Unclear etiology clinically or on exam, but exam and evaluation are reassuring in the emergency department.  Not suspicious for ACS at this point, because of past smoking history, we will go ahead and refer to cardiology other patient states that about a year ago he did a normal stress test.  Does not sound pulmonary in etiology, I am not suspicious of PE clinically.  Patient states he gets lightheaded if he bends down, we will  check orthostatics prior to discharge.  Is overall well-appearing.  We discussed follow-up with his primary care doctor, he will need referral.  I have also asked him to follow-up with cardiology.  DIFFERENTIAL DIAGNOSIS: Differential diagnosis includes, but is not limited to, ACS, aortic dissection, pulmonary embolism, cardiac tamponade, pneumothorax, pneumonia, pericarditis, myocarditis, GI-related causes including esophagitis/gastritis, and musculoskeletal chest wall pain.    CONSULTATIONS:   None   Patient / Family / Caregiver informed of clinical course, medical decision-making process, and agree with plan.   I discussed return precautions, follow-up instructions, and discharge instructions with patient and/or family.  Discharge Instructions : You are evaluated for chest discomfort, and although no certain cause was found, your exam and evaluation are overall reassuring in the emergency department today.  Return to the emergency department immediately for any worsening or uncontrolled pain, vomiting blood, abdominal pain, fever,  coughing up blood, shortness of breath, pain with breathing, leg swelling, or any other symptoms concerning to you.    ___________________________________________   FINAL CLINICAL IMPRESSION(S) / ED DIAGNOSES   Final diagnoses:  Nonspecific chest pain      ___________________________________________        Note: This dictation was prepared with Dragon dictation. Any transcriptional errors that result from this process are unintentional    Governor Rooks, MD 09/23/17 [phone]

## 2017-09-23 NOTE — Discharge Instructions (Signed)
You are evaluated for chest discomfort, and although no certain cause was found, your exam and evaluation are overall reassuring in the emergency department today.  Return to the emergency department immediately for any worsening or uncontrolled pain, vomiting blood, abdominal pain, fever, coughing up blood, shortness of breath, pain with breathing, leg swelling, or any other symptoms concerning to you.

## 2017-09-23 NOTE — ED Notes (Signed)
[facility], care assumed.

## 2017-09-23 NOTE — ED Notes (Signed)
Dr. Lord at bedside.  

## 2017-09-23 NOTE — ED Triage Notes (Signed)
Pt arrives ambulatory to triage with c/o chest pain x 1 day. Pt reports that he has vomited x 2 around 45 min ago. Pt describes a burning sensation and pressure in mid chest. Pt is in NAD.

## 2017-10-05 ENCOUNTER — Other Ambulatory Visit: Payer: Self-pay

## 2017-10-05 ENCOUNTER — Emergency Department
Admission: EM | Admit: 2017-10-05 | Discharge: 2017-10-05 | Disposition: A | Discharge status: Home / Self Care | Payer: PRIVATE HEALTH INSURANCE | Attending: Student in an Organized Health Care Education/Training Program | Admitting: Student in an Organized Health Care Education/Training Program

## 2017-10-05 ENCOUNTER — Encounter: Payer: Self-pay | Admitting: Emergency Medicine

## 2017-10-05 DIAGNOSIS — J101 Influenza due to other identified influenza virus with other respiratory manifestations: Secondary | ICD-10-CM | POA: Insufficient documentation

## 2017-10-05 DIAGNOSIS — Z87891 Personal history of nicotine dependence: Secondary | ICD-10-CM | POA: Insufficient documentation

## 2017-10-05 LAB — INFLUENZA PANEL BY PCR (TYPE A & B)
Influenza A By PCR: POSITIVE — AB
Influenza B By PCR: NEGATIVE

## 2017-10-05 NOTE — ED Notes (Signed)

## 2017-10-05 NOTE — ED Triage Notes (Addendum)
Patient ambulatory to triage with steady gait, without difficulty or distress noted, mask in place; pt reports since Monday having fever & body aches, HA; ibuprofen taken at 5pm

## 2017-10-05 NOTE — ED Provider Notes (Signed)
[facility] Emergency Department Provider Note  ____________________________________________  Time seen: Approximately 9:29 PM  I have reviewed the triage vital signs and the nursing notes.   HISTORY  Chief Complaint Generalized Body Aches    HPI Jesus Butler is a 32 y.o. male presents to the emergency department with headache, rhinorrhea, congestion, nonproductive cough, fever, malaise and body aches for the past 4 days.  Patient is tolerating fluids by mouth without emesis or diarrhea.  No recent travel.  No alleviating measures have been attempted.  He denies chest pain and abdominal pain.  Past Medical History:  Diagnosis Date  . Shoulder injury    left    There are no active problems to display for this patient.   Past Surgical History:  Procedure Laterality Date  . MULTIPLE TOOTH EXTRACTIONS    . SHOULDER ARTHROSCOPY Left 12/09/2015   Procedure: Arthroscopic subacromial decompression, left shoulder;  Surgeon: Christena FlakeJohn J Poggi, MD;  [facility];  Service: Orthopedics;  Laterality: Left;    Prior to Admission medications   Not on File    Allergies Other and Voltaren [diclofenac]  Family History  Problem Relation Age of Onset  . Heart disease Mother        stent placement x 2   . Hyperlipidemia Mother   . Hypertension Mother     Social History Social History   Tobacco Use  . Smoking status: Former Smoker    Types: E-cigarettes  . Smokeless tobacco: Never Used  . Tobacco comment: Quit smoking cigs about 2 yrs ago. Currently "vapes" 3-4x/day.  Substance Use Topics  . Alcohol use: Yes    Comment: may drink 4-5x/yr  . Drug use: No      Review of Systems  Constitutional: Patient has fever.  Eyes: No visual changes. No discharge ENT: Patient has congestion.  Cardiovascular: no chest pain. Respiratory: Patient has cough.  Gastrointestinal: No abdominal pain.  No nausea, no vomiting. Patient had diarrhea.   Genitourinary: Negative for dysuria. No hematuria Musculoskeletal: Patient has myalgias.  Skin: Negative for rash, abrasions, lacerations, ecchymosis. Neurological: Patient has headache, no focal weakness or numbness.    ______________________________________  Constitutional: Alert and oriented. Patient is lying supine. Eyes: Conjunctivae are normal. PERRL. EOMI. Head: Atraumatic. ENT:      Ears: Tympanic membranes are mildly injected with mild effusion bilaterally.       Nose: No congestion/rhinnorhea.      Mouth/Throat: Mucous membranes are moist. Posterior pharynx is mildly erythematous.  Hematological/Lymphatic/Immunilogical: No cervical lymphadenopathy.  Cardiovascular: Normal rate, regular rhythm. Normal S1 and S2.  Good peripheral circulation. Respiratory: Normal respiratory effort without tachypnea or retractions. Lungs CTAB. Good air entry to the bases with no decreased or absent breath sounds. Gastrointestinal: Bowel sounds 4 quadrants. Soft and nontender to palpation. No guarding or rigidity. No palpable masses. No distention. No CVA tenderness. Musculoskeletal: Full range of motion to all extremities. No gross deformities appreciated. Neurologic:  Normal speech and language. No gross focal neurologic deficits are appreciated.  Skin:  Skin is warm, dry and intact. No rash noted. Psychiatric: Mood and affect are normal. Speech and behavior are normal. Patient exhibits appropriate insight and judgement.    ____________________________________________   LABS (all labs ordered are listed, but only abnormal results are displayed)  Labs Reviewed  INFLUENZA PANEL BY PCR (TYPE A & B) - Abnormal; Notable for the following components:      Result Value   Influenza A By PCR POSITIVE (*)  All other components within normal limits   ____________________________________________  EKG   ____________________________________________  RADIOLOGY   No results  found.  ____________________________________________    PROCEDURES  Procedure(s) performed:    Procedures    Medications - No data to display   ____________________________________________   INITIAL IMPRESSION / ASSESSMENT AND PLAN / ED COURSE  Pertinent labs & imaging results that were available during my care of the patient were reviewed by me and considered in my medical decision making (see chart for details).  Review of the [place] CSRS was performed in accordance of the [facility] prior to dispensing any controlled drugs.     Assessment and plan Influenza Patient presents to the emergency department with flulike symptoms.  Differential diagnosis included influenza A versus unspecified viral URI.  Patient tested positive for influenza A in the emergency department.  He was discharged with Tamiflu.  Rest and hydration were encouraged.  Vital signs are reassuring prior to discharge.  All patient questions were answered.   ____________________________________________  FINAL CLINICAL IMPRESSION(S) / ED DIAGNOSES  Final diagnoses:  Influenza A      NEW MEDICATIONS STARTED DURING THIS VISIT:  ED Discharge Orders    None          This chart was dictated using voice recognition software/Dragon. Despite best efforts to proofread, errors can occur which can change the meaning. Any change was purely unintentional.    Gasper Lloyd 10/05/17 2133    Willy Eddy, MD 10/05/17 2242

## 2018-10-11 IMAGING — CR DG CHEST 2V
1 series · 2 of 2 positions shown · non-contrast
Comparison: 03/23/2016

CLINICAL DATA: Chest pain, onset yesterday.

EXAM:
CHEST  2 VIEW

[Series 1: dg chest 2 view · 0.14mm/px · 2 of 2 slices shown]
[im 1/2]
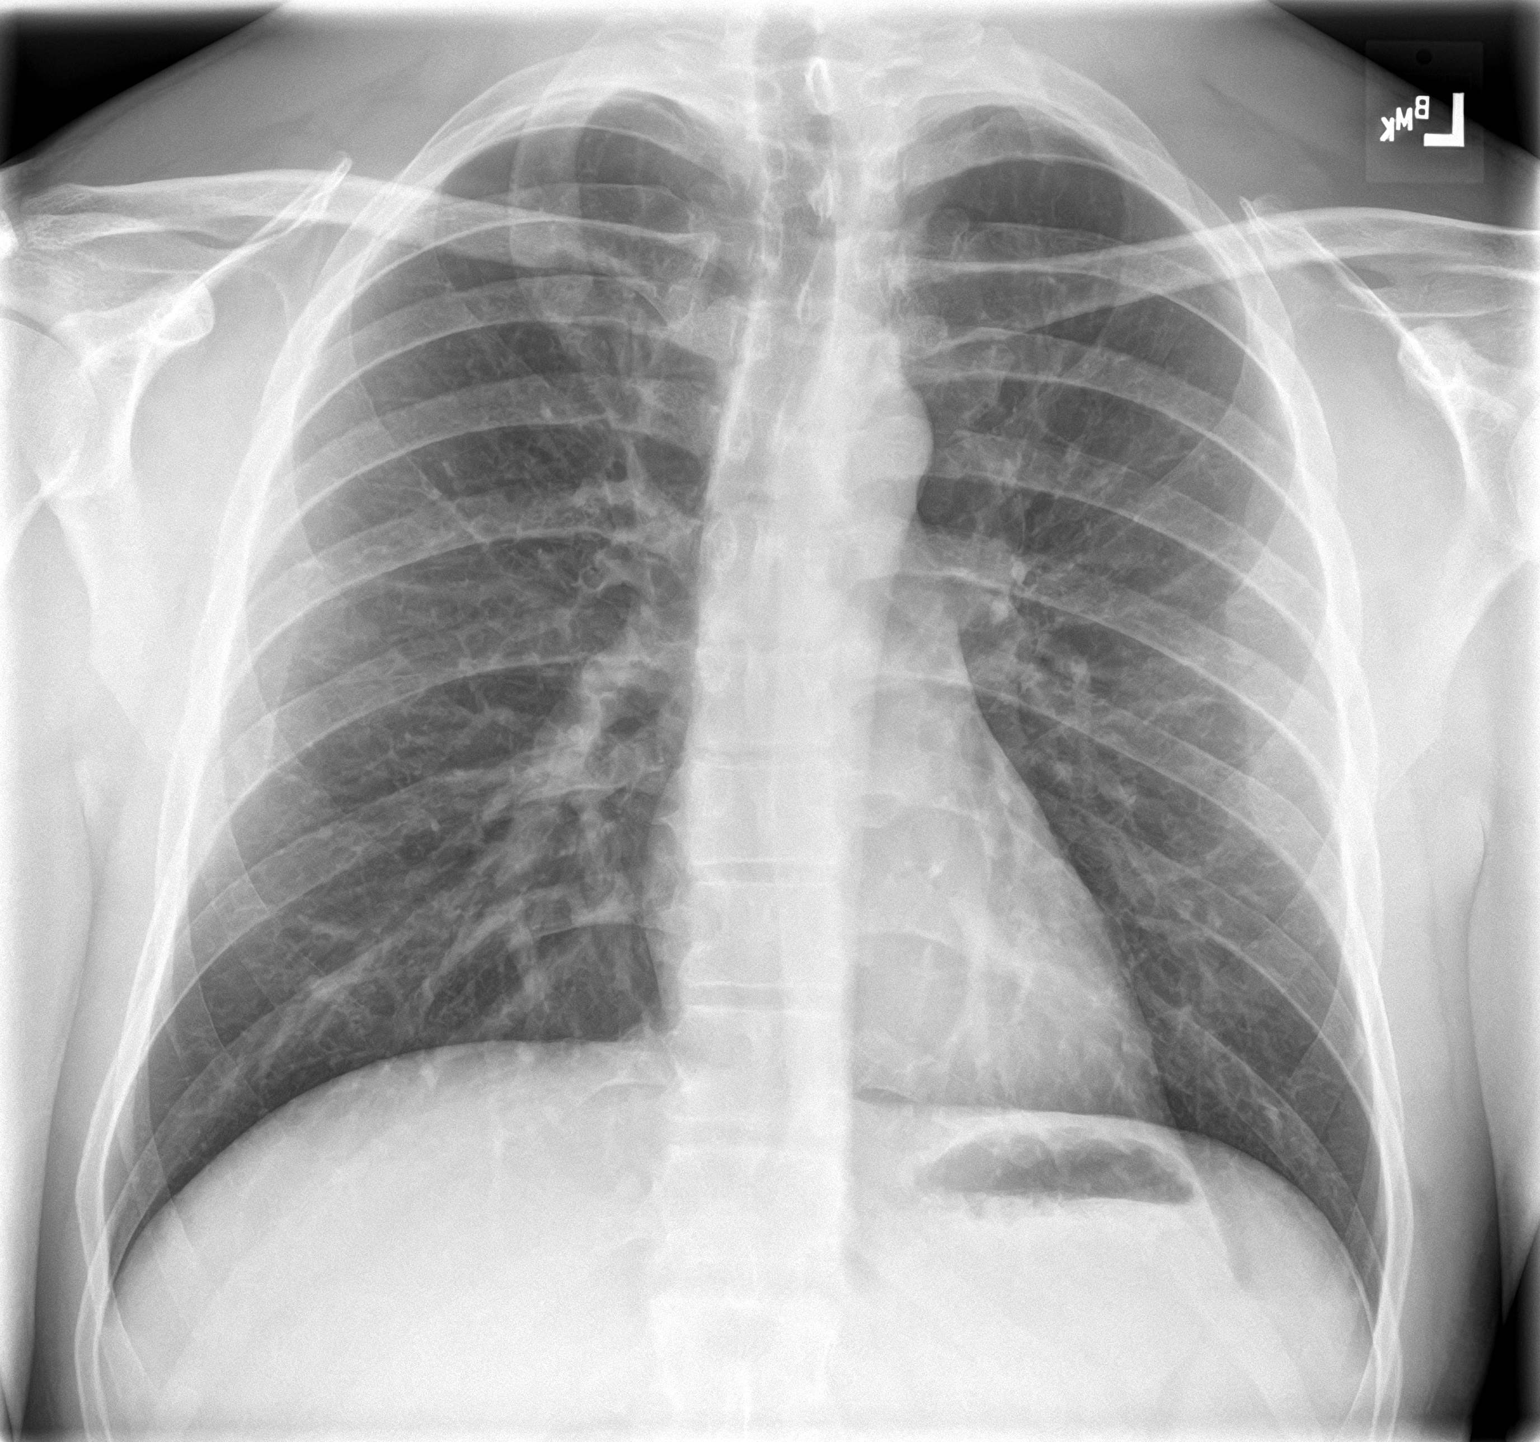
[im 2/2]
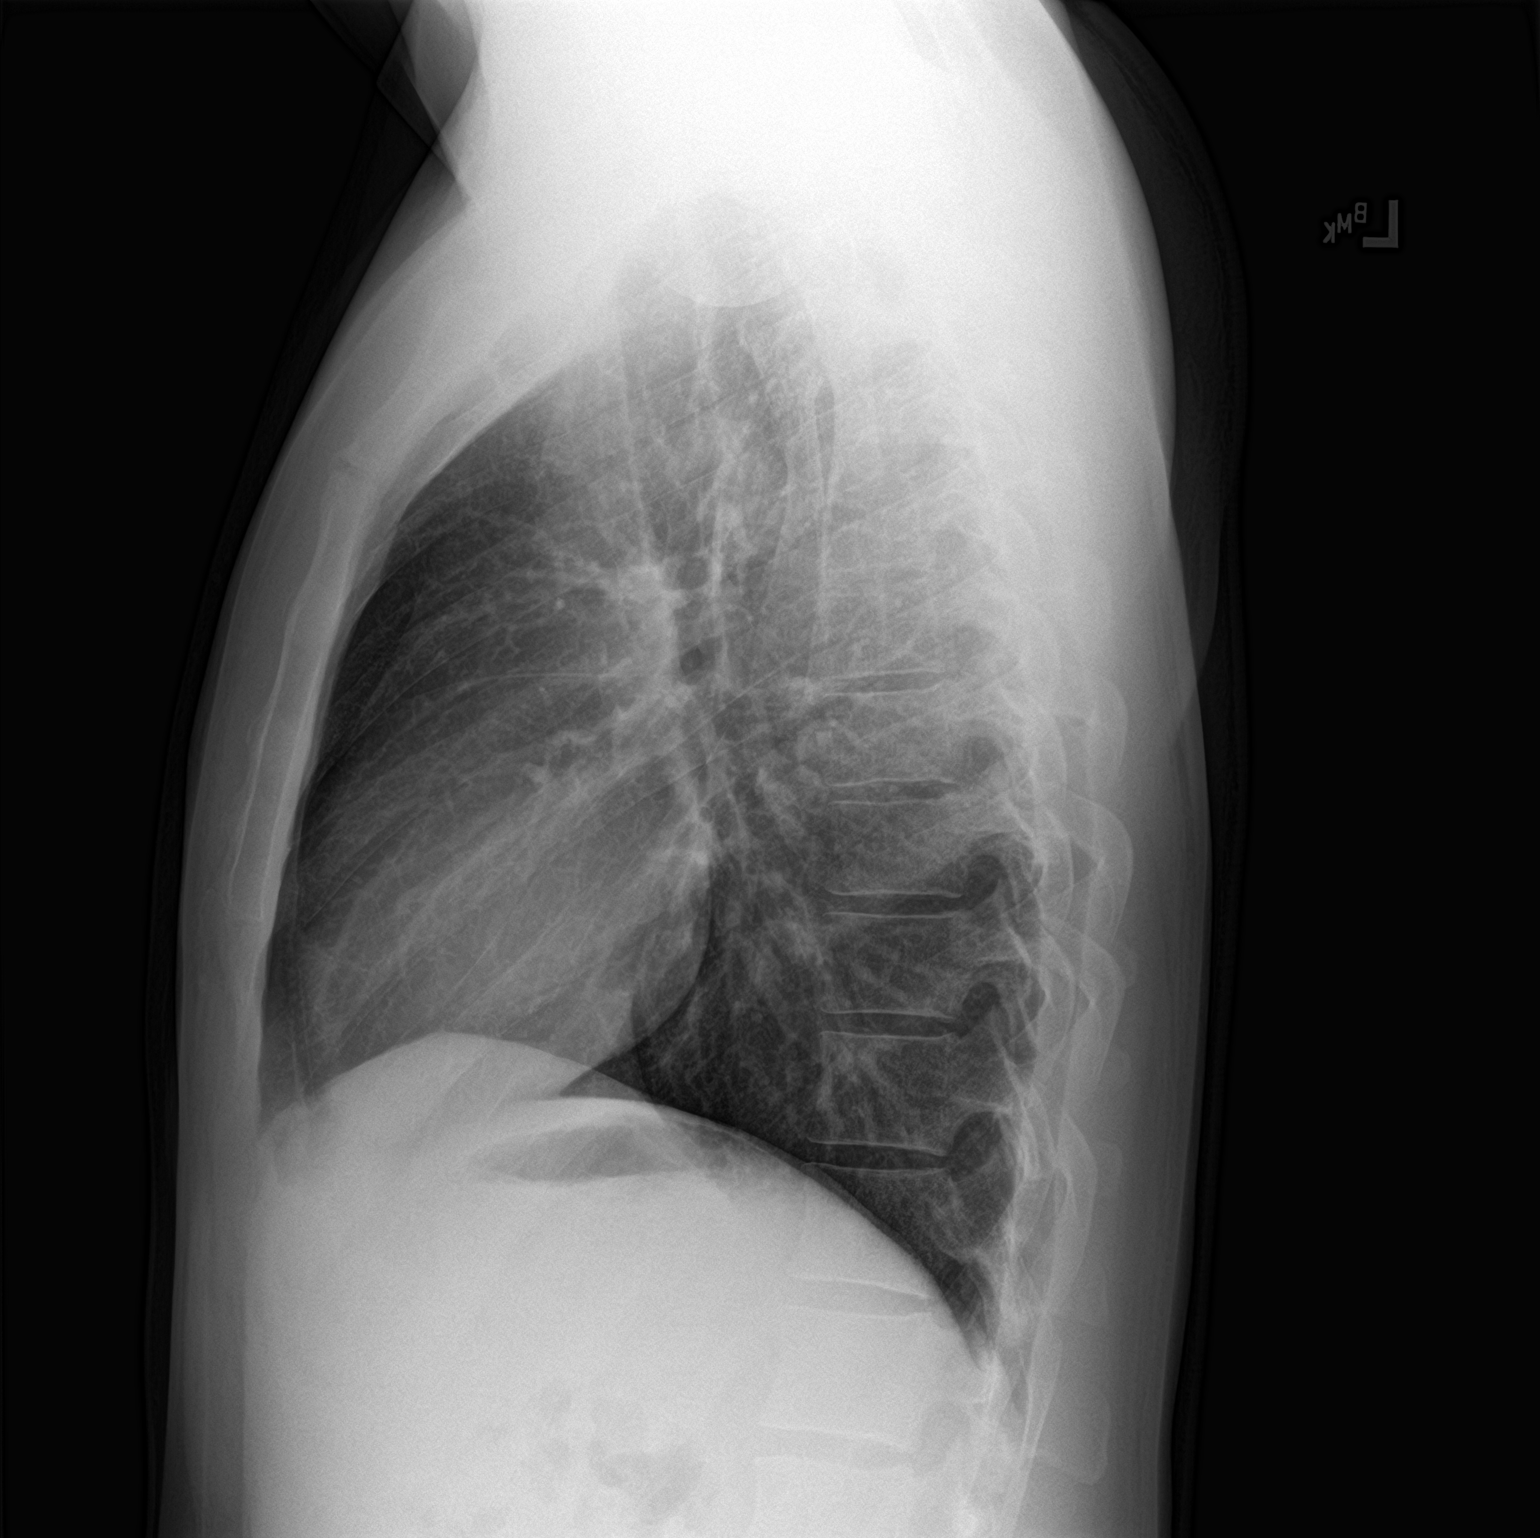

[2 of 2 positions shown; findings below may reference images not displayed]

FINDINGS: The cardiomediastinal contours are normal. The lungs are clear.
Pulmonary vasculature is normal. No consolidation, pleural effusion,
or pneumothorax. No acute osseous abnormalities are seen. Probable
bilateral cervical ribs.
IMPRESSION: Unremarkable radiographs of the chest.

## 2020-03-27 ENCOUNTER — Other Ambulatory Visit: Payer: Self-pay

## 2020-03-27 ENCOUNTER — Other Ambulatory Visit: Payer: PRIVATE HEALTH INSURANCE

## 2020-03-27 DIAGNOSIS — Z20822 Contact with and (suspected) exposure to covid-19: Secondary | ICD-10-CM

## 2020-03-28 LAB — NOVEL CORONAVIRUS, NAA: SARS-CoV-2, NAA: NOT DETECTED

## 2020-03-28 LAB — SARS-COV-2, NAA 2 DAY TAT

## 2020-03-31 ENCOUNTER — Other Ambulatory Visit: Payer: Self-pay | Admitting: Critical Care Medicine

## 2020-03-31 ENCOUNTER — Other Ambulatory Visit: Payer: PRIVATE HEALTH INSURANCE

## 2020-03-31 DIAGNOSIS — Z20822 Contact with and (suspected) exposure to covid-19: Secondary | ICD-10-CM

## 2020-04-01 LAB — SARS-COV-2, NAA 2 DAY TAT

## 2020-04-01 LAB — NOVEL CORONAVIRUS, NAA: SARS-CoV-2, NAA: NOT DETECTED

## 2020-04-17 ENCOUNTER — Other Ambulatory Visit: Payer: Self-pay | Admitting: Critical Care Medicine

## 2020-04-17 ENCOUNTER — Other Ambulatory Visit: Payer: PRIVATE HEALTH INSURANCE

## 2020-04-17 DIAGNOSIS — Z20822 Contact with and (suspected) exposure to covid-19: Secondary | ICD-10-CM

## 2020-04-20 LAB — NOVEL CORONAVIRUS, NAA: SARS-CoV-2, NAA: NOT DETECTED

## 2020-04-22 ENCOUNTER — Other Ambulatory Visit: Payer: PRIVATE HEALTH INSURANCE

## 2020-04-22 ENCOUNTER — Other Ambulatory Visit: Payer: Self-pay

## 2020-04-22 DIAGNOSIS — Z20822 Contact with and (suspected) exposure to covid-19: Secondary | ICD-10-CM

## 2020-04-23 LAB — SARS-COV-2, NAA 2 DAY TAT

## 2020-04-23 LAB — NOVEL CORONAVIRUS, NAA: SARS-CoV-2, NAA: NOT DETECTED

## 2021-02-16 ENCOUNTER — Encounter: Payer: Self-pay | Admitting: Emergency Medicine

## 2021-02-16 ENCOUNTER — Emergency Department
Admission: EM | Admit: 2021-02-16 | Discharge: 2021-02-16 | Disposition: A | Discharge status: Home / Self Care | Payer: BC Managed Care – PPO | Attending: Emergency Medicine | Admitting: Emergency Medicine

## 2021-02-16 DIAGNOSIS — W504XXA Accidental scratch by another person, initial encounter: Secondary | ICD-10-CM | POA: Insufficient documentation

## 2021-02-16 DIAGNOSIS — Y9389 Activity, other specified: Secondary | ICD-10-CM | POA: Insufficient documentation

## 2021-02-16 DIAGNOSIS — S0501XA Injury of conjunctiva and corneal abrasion without foreign body, right eye, initial encounter: Secondary | ICD-10-CM | POA: Insufficient documentation

## 2021-02-16 DIAGNOSIS — Z87891 Personal history of nicotine dependence: Secondary | ICD-10-CM | POA: Insufficient documentation

## 2021-02-16 DIAGNOSIS — S0591XA Unspecified injury of right eye and orbit, initial encounter: Secondary | ICD-10-CM | POA: Diagnosis present

## 2021-02-16 MED ORDER — FLUORESCEIN SODIUM 1 MG OP STRP
1.0000 | ORAL_STRIP | Freq: Once | OPHTHALMIC | Status: AC
Start: 2021-02-16 — End: 2021-02-16
  Administered 2021-02-16: 1 via OPHTHALMIC
  Filled 2021-02-16: qty 1

## 2021-02-16 MED ORDER — TETRACAINE HCL 0.5 % OP SOLN
1.0000 [drp] | Freq: Once | OPHTHALMIC | Status: AC
  Administered 2021-02-16: 1 [drp] via OPHTHALMIC
  Filled 2021-02-16: qty 4

## 2021-02-16 MED ORDER — ERYTHROMYCIN 5 MG/GM OP OINT: 1.0000 "application " | TOPICAL_OINTMENT | Freq: Every day | OPHTHALMIC | 0 refills | Status: AC

## 2021-02-16 NOTE — ED Triage Notes (Signed)
Pt reports he had his right eye scratched by daughter while playing approx 2 hours prior. Pt unable to open eye but when opened by writer, eye is red and watering.

## 2021-02-16 NOTE — Discharge Instructions (Addendum)
Apply 1/2 inch of erythromycin to right lower eyelid before bed.

## 2021-02-16 NOTE — ED Provider Notes (Signed)
[facility]-EMERGENCY DEPARTMENT  ____________________________________________  Time seen: Approximately 10:33 PM  I have reviewed the triage vital signs and the nursing notes.   HISTORY  Chief Complaint Eye Injury   Historian Patient    HPI Jesus Butler is a 35 y.o. male presents to the emergency department with acute right eye pain.  Patient reports that he was playing with his daughter who accidentally scratched eye.  Patient reports that it was initially difficult for him to open his eye but pain is since improved.  He endorses some mild blurry vision and increased tearing as well as photophobia.  Patient denies a history of corneal abrasion on the right in the past.  Patient wears sunglasses but does not wear prescription glasses or contact lenses.   Past Medical History:  Diagnosis Date   Shoulder injury    left     Immunizations up to date:  Yes.     Past Medical History:  Diagnosis Date   Shoulder injury    left    There are no problems to display for this patient.   Past Surgical History:  Procedure Laterality Date   MULTIPLE TOOTH EXTRACTIONS     SHOULDER ARTHROSCOPY Left 12/09/2015   Procedure: Arthroscopic subacromial decompression, left shoulder;  Surgeon: Christena Flake, MD;  Location: [facility];  Service: Orthopedics;  Laterality: Left;    Prior to Admission medications   Medication Sig Start Date End Date Taking? Authorizing Provider  erythromycin ophthalmic ointment Place 1 application into the right eye at bedtime for 7 days. 02/16/21 02/23/21 Yes Pia Mau M, PA-C    Allergies Other and Voltaren [diclofenac]  Family History  Problem Relation Age of Onset   Heart disease Mother        stent placement x 2    Hyperlipidemia Mother    Hypertension Mother     Social History Social History   Tobacco Use   Smoking status: Former    Pack years: 0.00    Types: E-cigarettes   Smokeless tobacco: Never   Tobacco comments:    Quit  smoking cigs about 2 yrs ago. Currently "vapes" 3-4x/day.  Substance Use Topics   Alcohol use: Yes    Comment: may drink 4-5x/yr   Drug use: No     Review of Systems  Constitutional: No fever/chills Eyes: Patient has right eye pain.  ENT: No upper respiratory complaints. Respiratory: no cough. No SOB/ use of accessory muscles to breath Gastrointestinal:   No nausea, no vomiting.  No diarrhea.  No constipation. Musculoskeletal: Negative for musculoskeletal pain.* Skin: Negative for rash, abrasions, lacerations, ecchymosis.    ____________________________________________   PHYSICAL EXAM:  VITAL SIGNS: ED Triage Vitals  Enc Vitals Group     BP 02/16/21 2030 (!) 138/91     Pulse Rate 02/16/21 2030 88     Resp 02/16/21 2030 16     Temp 02/16/21 2030 98.6 F (37 C)     Temp Source 02/16/21 2030 Oral     SpO2 02/16/21 2030 100 %     Weight 02/16/21 2030 193 lb (87.5 kg)     Height 02/16/21 2030 5\' 10"  (1.778 m)     Head Circumference --      Peak Flow --      Pain Score 02/16/21 2206 0     Pain Loc --      Pain Edu? --      Excl. in GC? --      Constitutional: Alert and  oriented. Well appearing and in no acute distress. Eyes: Patient has corneal abrasion visualized with fluorescein staining and right eye. PERRL. EOMI. Head: Atraumatic. ENT:      Ears:       Nose: No congestion/rhinnorhea.      Mouth/Throat: Mucous membranes are moist.  Neck: No stridor.  No cervical spine tenderness to palpation. Cardiovascular: Normal rate, regular rhythm. Normal S1 and S2.  Good peripheral circulation. Respiratory: Normal respiratory effort without tachypnea or retractions. Lungs CTAB. Good air entry to the bases with no decreased or absent breath sounds Gastrointestinal: Bowel sounds x 4 quadrants. Soft and nontender to palpation. No guarding or rigidity. No distention. Musculoskeletal: Full range of motion to all extremities. No obvious deformities noted Neurologic:  Normal for  age. No gross focal neurologic deficits are appreciated.  Skin:  Skin is warm, dry and intact. No rash noted. Psychiatric: Mood and affect are normal for age. Speech and behavior are normal.   ____________________________________________   LABS (all labs ordered are listed, but only abnormal results are displayed)  Labs Reviewed - No data to display ____________________________________________  EKG   ____________________________________________  RADIOLOGY   No results found.  ____________________________________________    PROCEDURES  Procedure(s) performed:     Procedures     Medications  fluorescein ophthalmic strip 1 strip (1 strip Right Eye Given by Other 02/16/21 2151)  tetracaine (PONTOCAINE) 0.5 % ophthalmic solution 1 drop (1 drop Right Eye Given by Other 02/16/21 2152)     ____________________________________________   INITIAL IMPRESSION / ASSESSMENT AND PLAN / ED COURSE  Pertinent labs & imaging results that were available during my care of the patient were reviewed by me and considered in my medical decision making (see chart for details).      Assessment and plan Corneal abrasion 35 year old male presents to the emergency department with a right eye corneal abrasion. Exam vital signs are reassuring at triage.  On physical exam, patient had small region of fluorescein uptake of the right cornea concerning for corneal abrasion.  Patient was discharged with erythromycin ointment and advised to follow-up with ophthalmology as needed.      ____________________________________________  FINAL CLINICAL IMPRESSION(S) / ED DIAGNOSES  Final diagnoses:  Abrasion of right cornea, initial encounter      NEW MEDICATIONS STARTED DURING THIS VISIT:  ED Discharge Orders          Ordered    erythromycin ophthalmic ointment  Daily at bedtime        02/16/21 2154                This chart was dictated using voice recognition  software/Dragon. Despite best efforts to proofread, errors can occur which can change the meaning. Any change was purely unintentional.     Orvil Feil, PA-C 02/16/21 2249    Gilles Chiquito, MD 02/18/21 [phone]
